# Patient Record
Sex: Female | Born: 1980 | Race: White | Hispanic: No | Marital: Married | Smoking: Never smoker
Health system: Southern US, Community
[De-identification: ages and names within clinical notes are randomized; demographics above are authoritative.]

## PROBLEM LIST (undated history)

## (undated) ENCOUNTER — Inpatient Hospital Stay (HOSPITAL_COMMUNITY): Payer: Self-pay

## (undated) DIAGNOSIS — F419 Anxiety disorder, unspecified: Secondary | ICD-10-CM

## (undated) DIAGNOSIS — L409 Psoriasis, unspecified: Secondary | ICD-10-CM

## (undated) HISTORY — PX: WISDOM TOOTH EXTRACTION: SHX21

## (undated) HISTORY — PX: OTHER SURGICAL HISTORY: SHX169

---

## 2001-11-28 ENCOUNTER — Other Ambulatory Visit: Admission: RE | Admit: 2001-11-28 | Discharge: 2001-11-28 | Payer: Self-pay | Admitting: Obstetrics & Gynecology

## 2002-11-29 ENCOUNTER — Other Ambulatory Visit: Admission: RE | Admit: 2002-11-29 | Discharge: 2002-11-29 | Payer: Self-pay | Admitting: Obstetrics & Gynecology

## 2004-06-08 ENCOUNTER — Emergency Department (HOSPITAL_COMMUNITY): Admission: EM | Admit: 2004-06-08 | Discharge: 2004-06-08 | Payer: Self-pay | Admitting: Family Medicine

## 2004-06-21 ENCOUNTER — Encounter (INDEPENDENT_AMBULATORY_CARE_PROVIDER_SITE_OTHER): Payer: Self-pay | Admitting: *Deleted

## 2004-06-21 ENCOUNTER — Ambulatory Visit (HOSPITAL_BASED_OUTPATIENT_CLINIC_OR_DEPARTMENT_OTHER): Admission: RE | Admit: 2004-06-21 | Discharge: 2004-06-21 | Payer: Self-pay | Admitting: Specialist

## 2005-04-19 ENCOUNTER — Emergency Department (HOSPITAL_COMMUNITY): Admission: EM | Admit: 2005-04-19 | Discharge: 2005-04-19 | Payer: Self-pay | Admitting: Family Medicine

## 2006-11-16 ENCOUNTER — Emergency Department (HOSPITAL_COMMUNITY): Admission: EM | Admit: 2006-11-16 | Discharge: 2006-11-16 | Payer: Self-pay | Admitting: Emergency Medicine

## 2009-02-12 ENCOUNTER — Inpatient Hospital Stay (HOSPITAL_COMMUNITY): Admission: AD | Admit: 2009-02-12 | Discharge: 2009-02-12 | Payer: Self-pay | Admitting: Obstetrics and Gynecology

## 2009-09-11 ENCOUNTER — Inpatient Hospital Stay (HOSPITAL_COMMUNITY): Admission: AD | Admit: 2009-09-11 | Discharge: 2009-09-11 | Payer: Self-pay | Admitting: Obstetrics and Gynecology

## 2009-09-11 ENCOUNTER — Ambulatory Visit: Payer: Self-pay | Admitting: Gynecology

## 2009-09-11 DIAGNOSIS — O209 Hemorrhage in early pregnancy, unspecified: Secondary | ICD-10-CM

## 2009-10-08 ENCOUNTER — Inpatient Hospital Stay (HOSPITAL_COMMUNITY): Admission: AD | Admit: 2009-10-08 | Discharge: 2009-10-12 | Payer: Self-pay | Admitting: Obstetrics and Gynecology

## 2010-05-07 LAB — CBC
HCT: 21.6 % — ABNORMAL LOW (ref 36.0–46.0)
Hemoglobin: 7.5 g/dL — ABNORMAL LOW (ref 12.0–15.0)
Hemoglobin: 7.6 g/dL — ABNORMAL LOW (ref 12.0–15.0)
MCH: 31.8 pg (ref 26.0–34.0)
MCH: 32.4 pg (ref 26.0–34.0)
MCHC: 34.7 g/dL (ref 30.0–36.0)
MCHC: 34.8 g/dL (ref 30.0–36.0)
MCHC: 35.2 g/dL (ref 30.0–36.0)
MCV: 91.7 fL (ref 78.0–100.0)
MCV: 92.2 fL (ref 78.0–100.0)
Platelets: 158 10*3/uL (ref 150–400)
Platelets: 207 10*3/uL (ref 150–400)
RDW: 13.4 % (ref 11.5–15.5)
RDW: 14 % (ref 11.5–15.5)
WBC: 10.3 10*3/uL (ref 4.0–10.5)
WBC: 10.8 10*3/uL — ABNORMAL HIGH (ref 4.0–10.5)

## 2010-05-07 LAB — RH IMMUNE GLOB WKUP(>/=20WKS)(NOT WOMEN'S HOSP)
Antibody Screen: NEGATIVE
Fetal Screen: NEGATIVE

## 2010-05-07 LAB — BASIC METABOLIC PANEL
Calcium: 7.6 mg/dL — ABNORMAL LOW (ref 8.4–10.5)
GFR calc Af Amer: >60 mL/min (ref >60)
GFR calc non Af Amer: >60 mL/min (ref >60)
Glucose, Bld: 107 mg/dL — ABNORMAL HIGH (ref 70–99)
Sodium: 134 mEq/L — ABNORMAL LOW (ref 135–145)

## 2010-05-08 LAB — URINE CULTURE: Culture  Setup Time: 201107230053

## 2010-05-08 LAB — URINALYSIS, ROUTINE W REFLEX MICROSCOPIC
Bilirubin Urine: NEGATIVE
Ketones, ur: NEGATIVE mg/dL
Nitrite: NEGATIVE
Protein, ur: NEGATIVE mg/dL
Urobilinogen, UA: 0.2 mg/dL (ref 0.0–1.0)

## 2010-05-17 ENCOUNTER — Other Ambulatory Visit: Payer: Self-pay | Admitting: Obstetrics and Gynecology

## 2010-05-24 LAB — CBC
Hemoglobin: 12.9 g/dL (ref 12.0–15.0)
MCHC: 34.1 g/dL (ref 30.0–36.0)
MCV: 88.8 fL (ref 78.0–100.0)
RBC: 4.27 MIL/uL (ref 3.87–5.11)
WBC: 8.3 10*3/uL (ref 4.0–10.5)

## 2010-05-24 LAB — URINALYSIS, ROUTINE W REFLEX MICROSCOPIC
Bilirubin Urine: NEGATIVE
Hgb urine dipstick: NEGATIVE
Nitrite: NEGATIVE
Specific Gravity, Urine: 1.015 (ref 1.005–1.030)
pH: 8 (ref 5.0–8.0)

## 2010-05-24 LAB — GC/CHLAMYDIA PROBE AMP, GENITAL: Chlamydia, DNA Probe: NEGATIVE

## 2010-05-24 LAB — ABO/RH: ABO/RH(D): A NEG

## 2010-07-09 NOTE — Op Note (Signed)
Monica Parrish, Monica Parrish                ACCOUNT NO.:  [id number]   MEDICAL RECORD NO.:  [id number]          PATIENT TYPE:  AMB   LOCATION:  DSC                          FACILITY:  [facility]   PHYSICIAN:  Gerald L. Truesdale, M.D.DATE OF BIRTH:  12-01-1980   DATE OF PROCEDURE:  DATE OF DISCHARGE:                                 OPERATIVE REPORT   HISTORY OF PRESENT ILLNESS:  This is a 30 year old lady with a large mass  involving the right posterior back, increased growth, pain, and discomfort.  The area has a consistent stable lipomatous mass.   PROCEDURES DONE:  Excision of mass with liposuction assistance.   ANESTHESIA:  General.   SUMMARY OF PROCEDURE:  The patient underwent general anesthesia, intubated  orally.  Prep was done to the back area after she was placed in the prone  position with Hibiclens soap and solution walled off with sterile towels and  draped in a sterile field.  Xylocaine 1/2% with epinephrine was injected  within the mass, 1-200,000 concentration, a total of 50 mL.  This was  allowed to stand and then incisions were made over the lateral part of the  mass.  A small area was removed for examination with gross examination of a  lipomatous mass.  Using liposuction assistance, we removed the mass as best  we could except the central area which is very firm.  I used a [place]  catheter 28-34.  In the central portion, I had to extend the incision more  and had to use Metzenbaum scissors to remove the mass which seemed to have  consistency and was deep under the fascia of the back.  The fascia was  opened and the mass was removed with sharp and blunt dissection in large  volumes.  After proper hemostasis, irrigation was done.  Hemostasis was  maintained with a Bovie and coagulation.  After this, the wound was closed  with multiple sutures of 5-0 nylon.  Soft fluffy dressings and pressure  dressings were applied.  Xeroform, 4 x 4s, ABDs, Hypafix tape.  She  withstood the  procedures very well and was taken to recovery in excellent  condition.      GLT/MEDQ  D:  06/21/2004  T:  06/21/2004  Job:  [id number]

## 2011-12-29 ENCOUNTER — Other Ambulatory Visit: Payer: Self-pay | Admitting: Family Medicine

## 2011-12-29 DIAGNOSIS — R51 Headache: Secondary | ICD-10-CM

## 2011-12-30 ENCOUNTER — Other Ambulatory Visit: Payer: Self-pay

## 2012-01-03 ENCOUNTER — Ambulatory Visit
Admission: RE | Admit: 2012-01-03 | Discharge: 2012-01-03 | Disposition: A | Discharge status: Home / Self Care | Payer: 59 | Source: Ambulatory Visit | Attending: Family Medicine | Admitting: Family Medicine

## 2012-01-03 DIAGNOSIS — R51 Headache: Secondary | ICD-10-CM

## 2012-03-12 ENCOUNTER — Ambulatory Visit
Admission: RE | Admit: 2012-03-12 | Discharge: 2012-03-12 | Disposition: A | Discharge status: Home / Self Care | Payer: 59 | Source: Ambulatory Visit | Attending: Internal Medicine | Admitting: Internal Medicine

## 2012-03-12 ENCOUNTER — Other Ambulatory Visit: Payer: Self-pay | Admitting: Internal Medicine

## 2012-03-12 DIAGNOSIS — R109 Unspecified abdominal pain: Secondary | ICD-10-CM

## 2012-03-12 MED ORDER — IOHEXOL 300 MG/ML  SOLN
100.0000 mL | Freq: Once | INTRAMUSCULAR | Status: AC | PRN
  Administered 2012-03-12: 100 mL via INTRAVENOUS

## 2012-05-24 ENCOUNTER — Other Ambulatory Visit: Payer: Self-pay | Admitting: Obstetrics and Gynecology

## 2015-08-06 ENCOUNTER — Other Ambulatory Visit: Payer: Self-pay | Admitting: Obstetrics and Gynecology

## 2016-09-20 LAB — OB RESULTS CONSOLE ANTIBODY SCREEN: Antibody Screen: NEGATIVE

## 2016-09-20 LAB — OB RESULTS CONSOLE GC/CHLAMYDIA
Chlamydia: NEGATIVE
Gonorrhea: NEGATIVE

## 2016-09-20 LAB — OB RESULTS CONSOLE HEPATITIS B SURFACE ANTIGEN: HEP B S AG: NEGATIVE

## 2016-09-20 LAB — OB RESULTS CONSOLE ABO/RH: RH TYPE: NEGATIVE

## 2016-09-20 LAB — OB RESULTS CONSOLE HIV ANTIBODY (ROUTINE TESTING): HIV: NONREACTIVE

## 2016-09-20 LAB — OB RESULTS CONSOLE RUBELLA ANTIBODY, IGM: RUBELLA: IMMUNE

## 2016-09-20 LAB — OB RESULTS CONSOLE RPR: RPR: NONREACTIVE

## 2016-11-25 ENCOUNTER — Other Ambulatory Visit (HOSPITAL_COMMUNITY): Payer: Self-pay | Admitting: Obstetrics and Gynecology

## 2016-11-25 DIAGNOSIS — Z3A20 20 weeks gestation of pregnancy: Secondary | ICD-10-CM

## 2016-11-25 DIAGNOSIS — Z3689 Encounter for other specified antenatal screening: Secondary | ICD-10-CM

## 2016-11-25 DIAGNOSIS — O35EXX0 Maternal care for other (suspected) fetal abnormality and damage, fetal genitourinary anomalies, not applicable or unspecified: Secondary | ICD-10-CM

## 2016-11-25 DIAGNOSIS — O09522 Supervision of elderly multigravida, second trimester: Secondary | ICD-10-CM

## 2016-11-25 DIAGNOSIS — O358XX0 Maternal care for other (suspected) fetal abnormality and damage, not applicable or unspecified: Secondary | ICD-10-CM

## 2016-11-28 ENCOUNTER — Encounter (HOSPITAL_COMMUNITY): Payer: Self-pay | Admitting: Obstetrics and Gynecology

## 2016-12-02 ENCOUNTER — Encounter (HOSPITAL_COMMUNITY): Payer: Self-pay | Admitting: *Deleted

## 2016-12-06 ENCOUNTER — Encounter (HOSPITAL_COMMUNITY): Payer: Self-pay

## 2016-12-06 ENCOUNTER — Ambulatory Visit (HOSPITAL_COMMUNITY)
Admission: RE | Admit: 2016-12-06 | Discharge: 2016-12-06 | Disposition: A | Discharge status: Home / Self Care | Payer: BC Managed Care – PPO | Source: Ambulatory Visit | Attending: Obstetrics and Gynecology | Admitting: Obstetrics and Gynecology

## 2016-12-06 ENCOUNTER — Other Ambulatory Visit (HOSPITAL_COMMUNITY): Payer: Self-pay | Admitting: *Deleted

## 2016-12-06 DIAGNOSIS — Z3689 Encounter for other specified antenatal screening: Secondary | ICD-10-CM | POA: Insufficient documentation

## 2016-12-06 DIAGNOSIS — O358XX0 Maternal care for other (suspected) fetal abnormality and damage, not applicable or unspecified: Secondary | ICD-10-CM

## 2016-12-06 DIAGNOSIS — Z3A20 20 weeks gestation of pregnancy: Secondary | ICD-10-CM | POA: Diagnosis not present

## 2016-12-06 DIAGNOSIS — O35EXX0 Maternal care for other (suspected) fetal abnormality and damage, fetal genitourinary anomalies, not applicable or unspecified: Secondary | ICD-10-CM

## 2016-12-06 DIAGNOSIS — O34219 Maternal care for unspecified type scar from previous cesarean delivery: Secondary | ICD-10-CM | POA: Insufficient documentation

## 2016-12-06 DIAGNOSIS — O09522 Supervision of elderly multigravida, second trimester: Secondary | ICD-10-CM | POA: Insufficient documentation

## 2016-12-06 DIAGNOSIS — O359XX0 Maternal care for (suspected) fetal abnormality and damage, unspecified, not applicable or unspecified: Secondary | ICD-10-CM

## 2016-12-06 HISTORY — DX: Anxiety disorder, unspecified: F41.9

## 2016-12-06 HISTORY — DX: Psoriasis, unspecified: L40.9

## 2016-12-07 ENCOUNTER — Other Ambulatory Visit (HOSPITAL_COMMUNITY): Payer: Self-pay

## 2016-12-08 DIAGNOSIS — Z3A20 20 weeks gestation of pregnancy: Secondary | ICD-10-CM | POA: Insufficient documentation

## 2016-12-08 DIAGNOSIS — O35EXX0 Maternal care for other (suspected) fetal abnormality and damage, fetal genitourinary anomalies, not applicable or unspecified: Secondary | ICD-10-CM | POA: Insufficient documentation

## 2016-12-08 DIAGNOSIS — O358XX0 Maternal care for other (suspected) fetal abnormality and damage, not applicable or unspecified: Secondary | ICD-10-CM | POA: Insufficient documentation

## 2016-12-08 NOTE — Progress Notes (Signed)
Genetic Counseling  High-Risk Gestation Note  Appointment Date:  12/08/2016 Referred By: Waynard Reeds, MD Date of Birth:  23-Nov-1980 Partner:  Monica Parrish   Pregnancy History: [id number] Estimated Date of Delivery: 04/21/17 Estimated Gestational Age: [redacted]w[redacted]d Attending: Ledon Snare, MD   [name] and her husband, Mr. Monica Parrish, were seen for genetic counseling because of ultrasound finding of bilateral urinary tract dilation.     In summary:  Reviewed ultrasound findings  Bilateral urinary tract dilation: Right kidney 16 mm and left kidney 14 mm  Reviewed results of previous screening  NIPS (Panorama)- within normal limits  Discussed plan for follow-up  Follow-up ultrasound- scheduled 01/17/17  Pediatric urology consult- will facilitate closer to the third trimester with [facility]  Discussed diagnostic testing options for chromosome analysis  Amniocentesis - declined  Reviewed family history concerns  Discussed carrier screening options- declined  CF  SMA  Hemoglobinopathies  Monica Parrish had ultrasound in her referring OB's office, which visualized bilateral hydronephrosis (urinary tract dilation, pyelectasis). Detailed ultrasound was performed today and bilateral urinary tract dilation (UTD) was visualized: right kidney measured 16 mm, and left kidney measured 14 mm.  Remaining visualized fetal anatomy was within normal limits.   We discussed that fetal UTD (hydronephrosis or pyelectasis)  is defined as the dilatation of the fetal renal pelvis/pelvises.  We discussed that there is some overlap between the normal and abnormal pelvic anteroposterior (AP) diameters, lending to variation in cut-off values.  However, most sources agree that >4 mm AP diameter is considered atypical at anatomy scan. We discussed that 28mm-7mm is typically referred to as mild pyelectasis, with 7 mm-10 mm referred to as moderate, and >10 mm referred to as  severe hydronephrosis.  Given the ultrasound findings, the UTD would be classified at UTD A2-3.   [name] was counseled that UTD is commonly observed by prenatal ultrasound and is estimated to occur in 2-3% of fetuses.  The female to female ratio is 2:1.  Fetal urinary tract dilation can be transient but larger dilations are more likely to be due to an underlying congenital anomaly of the kidney and urinary tract.   We discussed that hydronephrosis may be due to obstruction along the urinary tract, causing an excess collection of fluid in the renal pelvis and can also be caused by vesicoureteral reflux (VUR).  The differential diagnoses can include UPJ obstruction, vesicoureteral reflux (VUR), UVJ obstruction, PUV, and multi or polycystic kidneys.  She was counseled that a definitive etiology may not be determined until after delivery. Severe hydronephrosis is more likely to be associated with an underlying congenital anomaly of the kidney and urinary tract and also to require postnatal surgical intervention than milder cases of UTD.   UPJ obstruction is the most common cause of non-physiologic neonatal hydronephrosis and represents 44-65% of cases, and VUR represents the second most common cause.   [name] was also counseled regarding prenatal and postnatal management of hydronephrosis.  We discussed the options of serial ultrasounds and consultation with a pediatric urologist.  We discussed that a consultation can be delayed until 2-3 days of life; however, given that the left AP diameter is >10 mm, postnatal evaluation is recommended.  We discussed that hydronephrosis may require neonatal surgical intervention. Follow-up ultrasound is scheduled for 01/17/17. The couple are interested in a prenatal consultation with pediatric urology, to be facilitated with [facility] closer to the third trimester in pregnancy.   They were  counseled that hydronephrosis is most often  considered multifactorial in etiology, but dominant inheritance has been observed in some families.  We discussed that the finding of hydronephrosis/pyelectasis is associated with an increased risk for fetal aneuploidy.  This risk is highest when other anomalies or fetal differences are visualized.  In the case of isolated hydronephrosis/pyelectasis, the risk of aneuploidy is increased approximately 2 fold. [name] previously had noninvasive prenatal screening (NIPS)/prenatal cell free DNA testing, which was within normal limits for the conditions screened. We reviewed that specifically, she had Panorama through [facility], facilitated by her OB provider, indicating low risk (<1 in 10,000) for trisomy 8721, trisomy 5218, trisomy 3213, sex chromosome aneuploidies, and low risk for 22q11.2 deletion syndrome. We reviewed the methodology of this test, sensitivity and specificity. They understand that NIPS is highly sensitive and specific but is not considered to be diagnostic. We reviewed chromosomes, nondisjunction, and the association with risk for chromosome conditions due to nondisjunction with aging of the ova. We discussed less commonly other chromosome aberrations could be present in the context of fetal UTD such as deletions, duplications or inversions. We reviewed amniocentesis for karyotype and chromosome microarray analysis, including the benefits, risks, and limitations. [name] declined amniocentesis. They understand that prenatal screening and testing cannot diagnose or rule out all genetic conditions or birth defects.   [name] was provided with written information regarding cystic fibrosis (CF), spinal muscular atrophy (SMA) and hemoglobinopathies including the carrier frequency, availability of carrier screening and prenatal diagnosis if indicated.  In addition, we discussed that CF and hemoglobinopathies are routinely screened for as part of the [place] newborn screening panel.   After further discussion, they declined screening for CF, SMA and hemoglobinopathies.  Both family histories were reviewed and found to be noncontributory for birth defects, intellectual disability, and known genetic conditions. [name] reported a strong family history of colon cancer. Her mother was diagnosed at age 35 years old and died at age 257 years with colon cancer. The patient reported that her maternal aunt, maternal uncle, and maternal grandfather have had colon cancer also. Though most cancers are thought to be sporadic or due to environmental factors, some families appear to have a strong predisposition to cancers.  When considering a family history of cancer, we look for common types of cancer in multiple family members occurring at younger than typical ages. we discussed the option of meeting with a cancer genetic counselor to discuss any possible screening or testing options available. If she or her relatives would like to learn more about the family's chance for a possible inherited cancer syndrome, her doctor may refer her or her relatives to [facility] [phone]) or [facility] at [phone]. [name] reported that her physician is aware of this family history, and she is being screened appropriately. Without further information regarding the provided family history, an accurate genetic risk cannot be calculated. Further genetic counseling is warranted if more information is obtained.  [name] denied exposure to environmental toxins or chemical agents. She denied the use of alcohol, tobacco or street drugs. She denied significant viral illnesses during the course of her pregnancy.   I counseled this couple regarding the above risks and available options.  The approximate face-to-face time with the genetic counselor was 30 minutes.  Quinn PlowmanKaren Kyra Laffey, MS,  Certified Genetic Counselor 12/08/2016

## 2017-01-17 ENCOUNTER — Ambulatory Visit (HOSPITAL_COMMUNITY)
Admission: RE | Admit: 2017-01-17 | Discharge: 2017-01-17 | Disposition: A | Discharge status: Home / Self Care | Payer: BC Managed Care – PPO | Source: Ambulatory Visit | Attending: Obstetrics and Gynecology | Admitting: Obstetrics and Gynecology

## 2017-01-17 ENCOUNTER — Other Ambulatory Visit (HOSPITAL_COMMUNITY): Payer: Self-pay | Admitting: *Deleted

## 2017-01-17 ENCOUNTER — Encounter (HOSPITAL_COMMUNITY): Payer: Self-pay

## 2017-01-17 DIAGNOSIS — O34219 Maternal care for unspecified type scar from previous cesarean delivery: Secondary | ICD-10-CM | POA: Diagnosis not present

## 2017-01-17 DIAGNOSIS — Z362 Encounter for other antenatal screening follow-up: Secondary | ICD-10-CM | POA: Insufficient documentation

## 2017-01-17 DIAGNOSIS — O359XX0 Maternal care for (suspected) fetal abnormality and damage, unspecified, not applicable or unspecified: Secondary | ICD-10-CM | POA: Diagnosis not present

## 2017-01-17 DIAGNOSIS — Z3A26 26 weeks gestation of pregnancy: Secondary | ICD-10-CM | POA: Insufficient documentation

## 2017-01-17 DIAGNOSIS — O09522 Supervision of elderly multigravida, second trimester: Secondary | ICD-10-CM | POA: Diagnosis present

## 2017-01-17 DIAGNOSIS — N135 Crossing vessel and stricture of ureter without hydronephrosis: Secondary | ICD-10-CM

## 2017-02-15 ENCOUNTER — Encounter (HOSPITAL_COMMUNITY): Payer: Self-pay

## 2017-02-15 ENCOUNTER — Ambulatory Visit (HOSPITAL_COMMUNITY)
Admission: RE | Admit: 2017-02-15 | Discharge: 2017-02-15 | Disposition: A | Discharge status: Home / Self Care | Payer: BC Managed Care – PPO | Source: Ambulatory Visit | Attending: Obstetrics and Gynecology | Admitting: Obstetrics and Gynecology

## 2017-02-15 ENCOUNTER — Other Ambulatory Visit (HOSPITAL_COMMUNITY): Payer: Self-pay | Admitting: Maternal and Fetal Medicine

## 2017-02-15 DIAGNOSIS — Z3A30 30 weeks gestation of pregnancy: Secondary | ICD-10-CM

## 2017-02-15 DIAGNOSIS — O09523 Supervision of elderly multigravida, third trimester: Secondary | ICD-10-CM

## 2017-02-15 DIAGNOSIS — O359XX0 Maternal care for (suspected) fetal abnormality and damage, unspecified, not applicable or unspecified: Secondary | ICD-10-CM | POA: Insufficient documentation

## 2017-02-15 DIAGNOSIS — N135 Crossing vessel and stricture of ureter without hydronephrosis: Secondary | ICD-10-CM

## 2017-02-15 DIAGNOSIS — O34219 Maternal care for unspecified type scar from previous cesarean delivery: Secondary | ICD-10-CM | POA: Insufficient documentation

## 2017-02-27 ENCOUNTER — Other Ambulatory Visit: Payer: Self-pay

## 2017-02-27 ENCOUNTER — Encounter (HOSPITAL_COMMUNITY): Payer: Self-pay

## 2017-02-27 ENCOUNTER — Inpatient Hospital Stay (HOSPITAL_COMMUNITY)
Admission: AD | Admit: 2017-02-27 | Discharge: 2017-02-28 | Disposition: A | Discharge status: Home / Self Care | Payer: BC Managed Care – PPO | Source: Ambulatory Visit | Attending: Obstetrics and Gynecology | Admitting: Obstetrics and Gynecology

## 2017-02-27 DIAGNOSIS — R109 Unspecified abdominal pain: Secondary | ICD-10-CM | POA: Diagnosis present

## 2017-02-27 DIAGNOSIS — O4703 False labor before 37 completed weeks of gestation, third trimester: Secondary | ICD-10-CM | POA: Diagnosis not present

## 2017-02-27 DIAGNOSIS — Z3A32 32 weeks gestation of pregnancy: Secondary | ICD-10-CM | POA: Diagnosis not present

## 2017-02-27 DIAGNOSIS — O26893 Other specified pregnancy related conditions, third trimester: Secondary | ICD-10-CM | POA: Diagnosis present

## 2017-02-27 LAB — COMPREHENSIVE METABOLIC PANEL
ALT: 14 U/L (ref 14–54)
ANION GAP: 11 (ref 5–15)
AST: 19 U/L (ref 15–41)
Albumin: 2.8 g/dL — ABNORMAL LOW (ref 3.5–5.0)
Alkaline Phosphatase: 160 U/L — ABNORMAL HIGH (ref 38–126)
BUN: 9 mg/dL (ref 6–20)
CALCIUM: 8.7 mg/dL — AB (ref 8.9–10.3)
CO2: 20 mmol/L — AB (ref 22–32)
Chloride: 104 mmol/L (ref 101–111)
Creatinine, Ser: 0.6 mg/dL (ref 0.44–1.00)
Glucose, Bld: 94 mg/dL (ref 65–99)
Potassium: 3.9 mmol/L (ref 3.5–5.1)
SODIUM: 135 mmol/L (ref 135–145)
TOTAL PROTEIN: 6.6 g/dL (ref 6.5–8.1)
Total Bilirubin: 0.5 mg/dL (ref 0.3–1.2)

## 2017-02-27 LAB — CBC
HCT: 32.6 % — ABNORMAL LOW (ref 36.0–46.0)
Hemoglobin: 11 g/dL — ABNORMAL LOW (ref 12.0–15.0)
MCH: 28.7 pg (ref 26.0–34.0)
MCHC: 33.7 g/dL (ref 30.0–36.0)
MCV: 85.1 fL (ref 78.0–100.0)
Platelets: 222 10*3/uL (ref 150–400)
RBC: 3.83 MIL/uL — ABNORMAL LOW (ref 3.87–5.11)
RDW: 13.7 % (ref 11.5–15.5)
WBC: 11.5 10*3/uL — ABNORMAL HIGH (ref 4.0–10.5)

## 2017-02-27 LAB — URINALYSIS, ROUTINE W REFLEX MICROSCOPIC
BILIRUBIN URINE: NEGATIVE
GLUCOSE, UA: NEGATIVE mg/dL
Hgb urine dipstick: NEGATIVE
KETONES UR: NEGATIVE mg/dL
Nitrite: NEGATIVE
PH: 6 (ref 5.0–8.0)
Protein, ur: NEGATIVE mg/dL
SPECIFIC GRAVITY, URINE: 1.004 — AB (ref 1.005–1.030)

## 2017-02-27 MED ORDER — NIFEDIPINE 10 MG PO CAPS
10.0000 mg | ORAL_CAPSULE | ORAL | Status: DC | PRN
  Administered 2017-02-27 (×3): 10 mg via ORAL
  Filled 2017-02-27 (×3): qty 1

## 2017-02-27 MED ORDER — LACTATED RINGERS IV BOLUS (SEPSIS): 1000.0000 mL | Freq: Once | INTRAVENOUS | Status: DC

## 2017-02-27 MED ORDER — LACTATED RINGERS IV BOLUS (SEPSIS)
1000.0000 mL | Freq: Once | INTRAVENOUS | Status: AC
  Administered 2017-02-27: 1000 mL via INTRAVENOUS

## 2017-02-27 NOTE — MAU Note (Signed)
Pt presents to MAU with c/o lower abdominal pain that started around 1900 tonight. Pt denies VB and LOF. +FM

## 2017-02-27 NOTE — Discharge Instructions (Signed)

## 2017-02-27 NOTE — MAU Provider Note (Signed)
History     CSN: [id number]  Arrival date and time: 02/27/17 2014   First Provider Initiated Contact with Patient 02/27/17 2042     Chief Complaint  Patient presents with  . Abdominal Pain   HPI Monica Parrish is a 37 y.o. G2P1001 at [redacted]w[redacted]d who presents with lower abdominal cramping. She states she feels like she started having braxton hicks contractions around noon that became painful around 7pm. She states they are sometimes every 5 minutes and sometimes every 15 minutes. She rates the pain a 5/10 and has not tried anything for the pain. She denies any vaginal bleeding, leaking of fluid or discharge. Reports good fetal movement. She also reports increasing dizziness of the last few hours. Reports drinking water and eating regular meals. She states she was anemic at her 28 week labs but is not taking any iron supplements.  OB History    Gravida Para Term Preterm AB Living   2 1 1     1    SAB TAB Ectopic Multiple Live Births                  Past Medical History:  Diagnosis Date  . Anxiety   . Psoriasis     Past Surgical History:  Procedure Laterality Date  . CESAREAN SECTION    . Surgery Left arm Left    bone spur removed  . WISDOM TOOTH EXTRACTION      History reviewed. No pertinent family history.  Social History   Tobacco Use  . Smoking status: Never Smoker  . Smokeless tobacco: Never Used  Substance Use Topics  . Alcohol use: No  . Drug use: No    Allergies:  Allergies  Allergen Reactions  . Penicillins Rash    Medications Prior to Admission  Medication Sig Dispense Refill Last Dose  . Prenatal Multivit-Min-Fe-FA (PRENATAL VITAMINS PO) Take by mouth.   Taking  . RaNITidine HCl (ZANTAC PO) Take by mouth.   Taking    Review of Systems  Constitutional: Negative.  Negative for fatigue and fever.  HENT: Negative.   Respiratory: Negative.  Negative for shortness of breath.   Cardiovascular: Negative.  Negative for chest pain.  Gastrointestinal: Positive  for abdominal pain. Negative for constipation, diarrhea, nausea and vomiting.  Genitourinary: Negative.  Negative for dysuria, vaginal bleeding and vaginal discharge.  Neurological: Negative.  Negative for dizziness and headaches.   Physical Exam   Blood pressure 113/74, pulse (!) 112, temperature 97.9 F (36.6 C), temperature source Oral, resp. rate 18, SpO2 100 %.  Physical Exam  Nursing note and vitals reviewed. Constitutional: She is oriented to person, place, and time. She appears well-developed and well-nourished. No distress.  HENT:  Head: Normocephalic.  Eyes: Pupils are equal, round, and reactive to light.  Cardiovascular: Normal rate, regular rhythm and normal heart sounds.  Respiratory: Effort normal and breath sounds normal. No respiratory distress.  GI: Soft. Bowel sounds are normal. She exhibits no distension. There is no tenderness.  Neurological: She is alert and oriented to person, place, and time.  Skin: Skin is warm and dry.  Psychiatric: She has a normal mood and affect. Her behavior is normal. Judgment and thought content normal.   Dilation: Closed Cervical Position: Posterior Exam by:: Antony Odea, CNM  Fetal Tracing:  Baseline: 130 Variability: moderate Accels: 15x15 Decels: none  Toco: every 2-3 minutes  MAU Course  Procedures Results for orders placed or performed during the hospital encounter of 02/27/17 (from the past 24  hour(s))  Urinalysis, Routine w reflex microscopic     Status: Abnormal   Collection Time: 02/27/17  8:20 PM  Result Value Ref Range   Color, Urine STRAW (A) YELLOW   APPearance CLEAR CLEAR   Specific Gravity, Urine 1.004 (L) 1.005 - 1.030   pH 6.0 5.0 - 8.0   Glucose, UA NEGATIVE NEGATIVE mg/dL   Hgb urine dipstick NEGATIVE NEGATIVE   Bilirubin Urine NEGATIVE NEGATIVE   Ketones, ur NEGATIVE NEGATIVE mg/dL   Protein, ur NEGATIVE NEGATIVE mg/dL   Nitrite NEGATIVE NEGATIVE   Leukocytes, UA TRACE (A) NEGATIVE   RBC / HPF  0-5 0 - 5 RBC/hpf   WBC, UA 0-5 0 - 5 WBC/hpf   Bacteria, UA RARE (A) NONE SEEN   Squamous Epithelial / LPF 0-5 (A) NONE SEEN   Mucus PRESENT   CBC     Status: Abnormal   Collection Time: 02/27/17  8:53 PM  Result Value Ref Range   WBC 11.5 (H) 4.0 - 10.5 K/uL   RBC 3.83 (L) 3.87 - 5.11 MIL/uL   Hemoglobin 11.0 (L) 12.0 - 15.0 g/dL   HCT 16.132.6 (L) 09.636.0 - 04.546.0 %   MCV 85.1 78.0 - 100.0 fL   MCH 28.7 26.0 - 34.0 pg   MCHC 33.7 30.0 - 36.0 g/dL   RDW 40.913.7 81.111.5 - 91.415.5 %   Platelets 222 150 - 400 K/uL  Comprehensive metabolic panel     Status: Abnormal   Collection Time: 02/27/17  8:53 PM  Result Value Ref Range   Sodium 135 135 - 145 mmol/L   Potassium 3.9 3.5 - 5.1 mmol/L   Chloride 104 101 - 111 mmol/L   CO2 20 (L) 22 - 32 mmol/L   Glucose, Bld 94 65 - 99 mg/dL   BUN 9 6 - 20 mg/dL   Creatinine, Ser 7.820.60 0.44 - 1.00 mg/dL   Calcium 8.7 (L) 8.9 - 10.3 mg/dL   Total Protein 6.6 6.5 - 8.1 g/dL   Albumin 2.8 (L) 3.5 - 5.0 g/dL   AST 19 15 - 41 U/L   ALT 14 14 - 54 U/L   Alkaline Phosphatase 160 (H) 38 - 126 U/L   Total Bilirubin 0.5 0.3 - 1.2 mg/dL   GFR calc non Af Amer >60 >60 mL/min   GFR calc Af Amer >60 >60 mL/min   Anion gap 11 5 - 15    MDM UA CBC, CMP Procardia IV fluids Patient reports relief from contractions and no cervical change over 2 hours Reviewed with Dr. Dareen PianoAnderson- ok to discharge patient home Assessment and Plan   1. Threatened preterm labor, third trimester   2. [redacted] weeks gestation of pregnancy   3. Abdominal pain during pregnancy in third trimester    -Discharge home in stable condition -Preterm labor precautions discussed -[facility] -Patient may return to MAU as needed or if her condition were to change or worsen   Rolm BookbinderCaroline M Jakota Manthei CNM 02/27/2017, 11:12 PM

## 2017-02-28 MED ORDER — NIFEDIPINE 10 MG PO CAPS: 10.0000 mg | ORAL_CAPSULE | Freq: Four times a day (QID) | ORAL | 0 refills | Status: DC | PRN

## 2017-02-28 NOTE — MAU Note (Signed)
Pt was discharged and requested to be put back on the monitor as she said her contractions were getting stronger again.

## 2017-03-15 ENCOUNTER — Other Ambulatory Visit: Payer: Self-pay | Admitting: Obstetrics and Gynecology

## 2017-03-22 LAB — OB RESULTS CONSOLE GBS: GBS: NEGATIVE

## 2017-04-03 ENCOUNTER — Telehealth (HOSPITAL_COMMUNITY): Payer: Self-pay | Admitting: *Deleted

## 2017-04-03 ENCOUNTER — Encounter (HOSPITAL_COMMUNITY): Payer: Self-pay

## 2017-04-03 NOTE — Telephone Encounter (Signed)
Preadmission screen  

## 2017-04-04 ENCOUNTER — Encounter (HOSPITAL_COMMUNITY): Payer: Self-pay

## 2017-04-09 ENCOUNTER — Inpatient Hospital Stay (HOSPITAL_COMMUNITY)
Admission: AD | Admit: 2017-04-09 | Discharge: 2017-04-11 | DRG: 788 | Disposition: A | Discharge status: Home / Self Care | Payer: BC Managed Care – PPO | Source: Ambulatory Visit | Attending: Obstetrics and Gynecology | Admitting: Obstetrics and Gynecology

## 2017-04-09 ENCOUNTER — Inpatient Hospital Stay (HOSPITAL_COMMUNITY): Payer: BC Managed Care – PPO | Admitting: Anesthesiology

## 2017-04-09 ENCOUNTER — Other Ambulatory Visit: Payer: Self-pay

## 2017-04-09 ENCOUNTER — Encounter (HOSPITAL_COMMUNITY): Payer: Self-pay | Admitting: *Deleted

## 2017-04-09 ENCOUNTER — Encounter (HOSPITAL_COMMUNITY)
Admission: AD | Disposition: A | Discharge status: Home / Self Care | Payer: Self-pay | Source: Ambulatory Visit | Attending: Obstetrics and Gynecology

## 2017-04-09 DIAGNOSIS — Z6791 Unspecified blood type, Rh negative: Secondary | ICD-10-CM

## 2017-04-09 DIAGNOSIS — Z9889 Other specified postprocedural states: Secondary | ICD-10-CM

## 2017-04-09 DIAGNOSIS — Z3A38 38 weeks gestation of pregnancy: Secondary | ICD-10-CM

## 2017-04-09 DIAGNOSIS — O34211 Maternal care for low transverse scar from previous cesarean delivery: Principal | ICD-10-CM | POA: Diagnosis present

## 2017-04-09 DIAGNOSIS — O26893 Other specified pregnancy related conditions, third trimester: Secondary | ICD-10-CM | POA: Diagnosis present

## 2017-04-09 LAB — CBC
HEMATOCRIT: 33.2 % — AB (ref 36.0–46.0)
Hemoglobin: 11.3 g/dL — ABNORMAL LOW (ref 12.0–15.0)
MCH: 28.8 pg (ref 26.0–34.0)
MCHC: 34 g/dL (ref 30.0–36.0)
MCV: 84.7 fL (ref 78.0–100.0)
PLATELETS: 227 10*3/uL (ref 150–400)
RBC: 3.92 MIL/uL (ref 3.87–5.11)
RDW: 14.3 % (ref 11.5–15.5)
WBC: 11.5 10*3/uL — ABNORMAL HIGH (ref 4.0–10.5)

## 2017-04-09 LAB — URINALYSIS, ROUTINE W REFLEX MICROSCOPIC
BILIRUBIN URINE: NEGATIVE
Glucose, UA: NEGATIVE mg/dL
Hgb urine dipstick: NEGATIVE
Ketones, ur: NEGATIVE mg/dL
Nitrite: NEGATIVE
Protein, ur: NEGATIVE mg/dL
SPECIFIC GRAVITY, URINE: 1.013 (ref 1.005–1.030)
pH: 7 (ref 5.0–8.0)

## 2017-04-09 SURGERY — Surgical Case
Anesthesia: Spinal

## 2017-04-09 MED ORDER — LACTATED RINGERS IV SOLN
INTRAVENOUS | Status: DC | PRN
  Administered 2017-04-09: 23:00:00 via INTRAVENOUS

## 2017-04-09 MED ORDER — ONDANSETRON HCL 4 MG/2ML IJ SOLN
INTRAMUSCULAR | Status: AC
  Filled 2017-04-09: qty 2

## 2017-04-09 MED ORDER — GENTAMICIN SULFATE 40 MG/ML IJ SOLN
INTRAVENOUS | Status: DC
  Filled 2017-04-09: qty 11.5

## 2017-04-09 MED ORDER — DEXAMETHASONE SODIUM PHOSPHATE 10 MG/ML IJ SOLN
INTRAMUSCULAR | Status: DC | PRN
  Administered 2017-04-09: 10 mg via INTRAVENOUS

## 2017-04-09 MED ORDER — FENTANYL CITRATE (PF) 100 MCG/2ML IJ SOLN
INTRAMUSCULAR | Status: AC
  Filled 2017-04-09: qty 2

## 2017-04-09 MED ORDER — MORPHINE SULFATE (PF) 0.5 MG/ML IJ SOLN
INTRAMUSCULAR | Status: AC
  Filled 2017-04-09: qty 10

## 2017-04-09 MED ORDER — MORPHINE SULFATE (PF) 0.5 MG/ML IJ SOLN
INTRAMUSCULAR | Status: DC | PRN
  Administered 2017-04-09: .2 mg via INTRATHECAL

## 2017-04-09 MED ORDER — FAMOTIDINE IN NACL 20-0.9 MG/50ML-% IV SOLN
20.0000 mg | Freq: Once | INTRAVENOUS | Status: AC
  Administered 2017-04-09: 20 mg via INTRAVENOUS
  Filled 2017-04-09: qty 50

## 2017-04-09 MED ORDER — PHENYLEPHRINE 40 MCG/ML (10ML) SYRINGE FOR IV PUSH (FOR BLOOD PRESSURE SUPPORT)
PREFILLED_SYRINGE | INTRAVENOUS | Status: AC
  Filled 2017-04-09: qty 10

## 2017-04-09 MED ORDER — SODIUM CHLORIDE 0.9 % IR SOLN
Status: DC | PRN
  Administered 2017-04-09: 1000 mL

## 2017-04-09 MED ORDER — DEXAMETHASONE SODIUM PHOSPHATE 10 MG/ML IJ SOLN
INTRAMUSCULAR | Status: AC
  Filled 2017-04-09: qty 1

## 2017-04-09 MED ORDER — CLINDAMYCIN PHOSPHATE 900 MG/50ML IV SOLN: 900.0000 mg | INTRAVENOUS | Status: DC

## 2017-04-09 MED ORDER — LACTATED RINGERS IV SOLN
INTRAVENOUS | Status: DC
  Administered 2017-04-09 (×2): via INTRAVENOUS

## 2017-04-09 MED ORDER — ATROPINE SULFATE 0.4 MG/ML IJ SOLN
INTRAMUSCULAR | Status: DC | PRN
  Administered 2017-04-09: 0.2 mg via INTRAVENOUS

## 2017-04-09 MED ORDER — PHENYLEPHRINE 8 MG IN D5W 100 ML (0.08MG/ML) PREMIX OPTIME
INJECTION | INTRAVENOUS | Status: AC
  Filled 2017-04-09: qty 100

## 2017-04-09 MED ORDER — OXYTOCIN 10 UNIT/ML IJ SOLN
INTRAMUSCULAR | Status: AC
  Filled 2017-04-09: qty 4

## 2017-04-09 MED ORDER — FENTANYL CITRATE (PF) 100 MCG/2ML IJ SOLN
INTRAMUSCULAR | Status: DC | PRN
  Administered 2017-04-09: 10 ug via INTRATHECAL

## 2017-04-09 MED ORDER — SOD CITRATE-CITRIC ACID 500-334 MG/5ML PO SOLN
30.0000 mL | Freq: Once | ORAL | Status: AC
  Administered 2017-04-09: 30 mL via ORAL
  Filled 2017-04-09: qty 15

## 2017-04-09 MED ORDER — OXYTOCIN 10 UNIT/ML IJ SOLN
INTRAVENOUS | Status: DC | PRN
  Administered 2017-04-09: 40 [IU] via INTRAVENOUS

## 2017-04-09 MED ORDER — GENTAMICIN SULFATE 40 MG/ML IJ SOLN: 5.0000 mg/kg | INTRAMUSCULAR | Status: DC

## 2017-04-09 MED ORDER — PHENYLEPHRINE 40 MCG/ML (10ML) SYRINGE FOR IV PUSH (FOR BLOOD PRESSURE SUPPORT)
PREFILLED_SYRINGE | INTRAVENOUS | Status: DC | PRN
  Administered 2017-04-09: 80 ug via INTRAVENOUS

## 2017-04-09 MED ORDER — SCOPOLAMINE 1 MG/3DAYS TD PT72
MEDICATED_PATCH | TRANSDERMAL | Status: DC | PRN
  Administered 2017-04-09: 1 via TRANSDERMAL

## 2017-04-09 MED ORDER — ONDANSETRON HCL 4 MG/2ML IJ SOLN
INTRAMUSCULAR | Status: DC | PRN
  Administered 2017-04-09: 4 mg via INTRAVENOUS

## 2017-04-09 MED ORDER — LACTATED RINGERS IV BOLUS (SEPSIS)
1000.0000 mL | Freq: Once | INTRAVENOUS | Status: AC
  Administered 2017-04-09: 1000 mL via INTRAVENOUS

## 2017-04-09 MED ORDER — BUPIVACAINE IN DEXTROSE 0.75-8.25 % IT SOLN
INTRATHECAL | Status: DC | PRN
  Administered 2017-04-09: 1.6 mg via INTRATHECAL

## 2017-04-09 MED ORDER — SCOPOLAMINE 1 MG/3DAYS TD PT72
MEDICATED_PATCH | TRANSDERMAL | Status: AC
  Filled 2017-04-09: qty 1

## 2017-04-09 MED ORDER — ATROPINE SULFATE 0.4 MG/ML IJ SOLN
INTRAMUSCULAR | Status: AC
  Filled 2017-04-09: qty 1

## 2017-04-09 SURGICAL SUPPLY — 32 items
CHLORAPREP W/TINT 26ML (MISCELLANEOUS) ×3 IMPLANT
CLAMP CORD UMBIL (MISCELLANEOUS) IMPLANT
CLOTH BEACON ORANGE TIMEOUT ST (SAFETY) ×3 IMPLANT
DERMABOND ADVANCED (GAUZE/BANDAGES/DRESSINGS)
DERMABOND ADVANCED .7 DNX12 (GAUZE/BANDAGES/DRESSINGS) IMPLANT
DRSG OPSITE POSTOP 4X10 (GAUZE/BANDAGES/DRESSINGS) ×3 IMPLANT
ELECT REM PT RETURN 9FT ADLT (ELECTROSURGICAL) ×3
ELECTRODE REM PT RTRN 9FT ADLT (ELECTROSURGICAL) ×1 IMPLANT
EXTRACTOR VACUUM M CUP 4 TUBE (SUCTIONS) IMPLANT
EXTRACTOR VACUUM M CUP 4' TUBE (SUCTIONS)
GLOVE BIO SURGEON STRL SZ7 (GLOVE) ×3 IMPLANT
GLOVE BIOGEL PI IND STRL 7.0 (GLOVE) ×1 IMPLANT
GLOVE BIOGEL PI INDICATOR 7.0 (GLOVE) ×2
GOWN STRL REUS W/TWL LRG LVL3 (GOWN DISPOSABLE) ×6 IMPLANT
KIT ABG SYR 3ML LUER SLIP (SYRINGE) IMPLANT
NEEDLE HYPO 22GX1.5 SAFETY (NEEDLE) IMPLANT
NEEDLE HYPO 25X5/8 SAFETYGLIDE (NEEDLE) IMPLANT
NS IRRIG 1000ML POUR BTL (IV SOLUTION) ×3 IMPLANT
PACK C SECTION WH (CUSTOM PROCEDURE TRAY) ×3 IMPLANT
PAD OB MATERNITY 4.3X12.25 (PERSONAL CARE ITEMS) ×3 IMPLANT
PENCIL SMOKE EVAC W/HOLSTER (ELECTROSURGICAL) ×3 IMPLANT
RTRCTR C-SECT PINK 25CM LRG (MISCELLANEOUS) ×3 IMPLANT
SUT CHROMIC 1 CTX 36 (SUTURE) ×6 IMPLANT
SUT CHROMIC 2 0 CT 1 (SUTURE) ×3 IMPLANT
SUT PDS AB 0 CTX 60 (SUTURE) ×3 IMPLANT
SUT PLAIN 2 0 XLH (SUTURE) ×3 IMPLANT
SUT VIC AB 2-0 CT1 27 (SUTURE) ×2
SUT VIC AB 2-0 CT1 TAPERPNT 27 (SUTURE) ×1 IMPLANT
SUT VIC AB 4-0 KS 27 (SUTURE) IMPLANT
SYR 30ML LL (SYRINGE) IMPLANT
TOWEL OR 17X24 6PK STRL BLUE (TOWEL DISPOSABLE) ×3 IMPLANT
TRAY FOLEY BAG SILVER LF 14FR (SET/KITS/TRAYS/PACK) ×3 IMPLANT

## 2017-04-09 NOTE — H&P (Signed)
37 y.o.  G2P1001 1572w2d comes in with labor.  Pt desires a repeat cesarean section at term.  Patient has good fetal movement and no bleeding.   Past Medical History:  Diagnosis Date  . Anxiety   . Psoriasis     Past Surgical History:  Procedure Laterality Date  . CESAREAN SECTION    . Surgery Left arm Left    bone spur removed  . WISDOM TOOTH EXTRACTION      OB History  Gravida Para Term Preterm AB Living  2 1 1     1   SAB TAB Ectopic Multiple Live Births          1    # Outcome Date GA Lbr Len/2nd Weight Sex Delivery Anes PTL Lv  2 Current           1 Term 2011 4025w0d  7 lb 4 oz (3.289 kg) M CS-LTranv Spinal  LIV      Social History   Socioeconomic History  . Marital status: Married    Spouse name: Not on file  . Number of children: Not on file  . Years of education: Not on file  . Highest education level: Not on file  Social Needs  . Financial resource strain: Not on file  . Food insecurity - worry: Not on file  . Food insecurity - inability: Not on file  . Transportation needs - medical: Not on file  . Transportation needs - non-medical: Not on file  Occupational History  . Not on file  Tobacco Use  . Smoking status: Never Smoker  . Smokeless tobacco: Never Used  Substance and Sexual Activity  . Alcohol use: No  . Drug use: No  . Sexual activity: Yes  Other Topics Concern  . Not on file  Social History Narrative  . Not on file   Penicillins   Prenatal Course: Uncomplicated except for UPJ dilation, see below.  Pt recently exposed to flu but has done prophylactic Tamiflu and had her flu shot.  ANT has been reassuring.   Prenatal Transfer Tool  Maternal Diabetes: No Genetic Screening: Normal Female NIPT Maternal Ultrasounds/Referrals: Abnormal:  Findings:   Fetal Kidney Anomalies Fetal Ultrasounds or other Referrals:  Referred to Materal Fetal Medicine   Fetal Pyelectasis: @ 18wk  RRP 8.4 mm and LRP 9.7. UTD A2-3 - MFM @ 20wk  R 16mm, L 14mm - MFM: EFW 2979  gm ( 6#9) 89% (AC.97%). AFI 18.17. Peristant bilateral UTD A2-3 (UPJ obstruction. 3.5 & 4 cm. OK for local delivery. Baby will likely go to NICU.  Maternal Substance Abuse:  No Significant Maternal Medications:  None Significant Maternal Lab Results: None  Vitals:   04/09/17 2001  BP: 115/71  Pulse: 86  Resp: 18  Temp: (!) 97.5 F (36.4 C)  TempSrc: Oral  SpO2: 100%  Weight: 203 lb (92.1 kg)  Height: 5\' 6"  (1.676 m)    Lungs/Cor:  NAD Abdomen:  soft, gravid Ex:  no cords, erythema SVE:  4/C/-2 FHTs:  NST R. REgular contractions.   A/P   For repeat cesarean sectionat term.  All risks, benefits and alternatives discussed with patient and she desires to proceed.  Will alert Neo to baby's urinary tract.    Seeley Southgate A

## 2017-04-09 NOTE — Anesthesia Procedure Notes (Signed)
Spinal  Patient location during procedure: OB Start time: 04/09/2017 10:53 PM End time: 04/09/2017 10:58 PM Staffing Anesthesiologist: Lowella CurbMiller, Warren Ray, MD Performed: anesthesiologist  Preanesthetic Checklist Completed: patient identified, surgical consent, pre-op evaluation, timeout performed, IV checked, risks and benefits discussed and monitors and equipment checked Spinal Block Patient position: sitting Prep: Betadine and site prepped and draped Patient monitoring: heart rate, cardiac monitor, continuous pulse ox and blood pressure Approach: midline Location: L3-4 Injection technique: single-shot Needle Needle type: Pencan  Needle gauge: 24 G Needle length: 10 cm Assessment Sensory level: T4

## 2017-04-09 NOTE — Brief Op Note (Signed)
04/09/2017  11:48 PM  PATIENT:  Monica Parrish  37 y.o. female  PRE-OPERATIVE DIAGNOSIS:  HX C/S  POST-OPERATIVE DIAGNOSIS:  HX C/S  PROCEDURE:  Procedure(s) with comments: CESAREAN SECTION (N/A) - RNFA PLEASE  SURGEON:  Surgeon(s) and Role: Carrington ClampMichelle Sharilyn Geisinger, MD  ANESTHESIA:   spinal  EBL:  756 mL   SPECIMEN:  No Specimen  DISPOSITION OF SPECIMEN:  N/A  COUNTS:  YES  TOURNIQUET:  * No tourniquets in log *  DICTATION: .Note written in EPIC  PLAN OF CARE: Admit to inpatient   PATIENT DISPOSITION:  PACU - hemodynamically stable.   Delay start of Pharmacological VTE agent (>24hrs) due to surgical blood loss or risk of bleeding: not applicable  Complications:  None. Medications:  Ancef, Pitocin Findings:  Baby female, Apgars 8,9, weight P.   Baby received 30 seconds of delayed cord clamp.  Normal tubes, ovaries and uterus seen.  Baby was skin to skin with mother after birth in the OR.  Baby voided 3 times after birth and was cleared by Neo to stay with mother in NBN.  Technique:  After adequate spinal anesthesia was achieved, the patient was prepped and draped in usual sterile fashion.  A foley catheter was used to drain the bladder.  A pfannanstiel incision was made with the scalpel and carried down to the fascia with the bovie cautery. The fascia was incised in the midline with the scalpel and carried in a transverse curvilinear manner bilaterally.  The fascia was reflected superiorly and inferiorly off the rectus muscles and the muscles split in the midline.  A bowel free portion of the peritoneum was entered bluntly and then extended in a superior and inferior manner with good visualization of the bowel and bladder.  The Alexis instrument was then placed and the vesico-uterine fascia tented up and incised in a transverse curvilinear manner.  A 2 cm transverse incision was made in the upper portion of the lower uterine segment until the amnion was exposed.   The incision was  extended transversely in a blunt manner.  Clear fluid was noted and the baby delivered in the vertex presentation without complication.  The baby was bulb suctioned and the cord was clamped and cut aftet stripping blood from cord into baby.  The baby was then handed to awaiting Neonatology.  The placenta was then delivered manually and the uterus cleared of all debris.  The uterine incision was then closed with a running lock stitch of 0 monocryl.  An imbricating layer of 0 monocryl was closed as well. Excellent hemostasis of the uterine incision was achieved and the abdomen was cleared with irrigation.  The peritoneum was closed with a running stitch of 2-0 vicryl.  This incorporated the rectus muscles as a separate layer.  The fascia was then closed with a running stitch of 0 vicryl.  The subcutaneous layer was closed with interrupted  stitches of 2-0 plain gut.  The skin was closed with 4-0 vicryl on a Keith needle and steri-strips.  The patient tolerated the procedure well and was returned to the recovery room in stable condition.  All counts were correct times three.  Dorella Laster A

## 2017-04-09 NOTE — Op Note (Signed)
04/09/2017  11:48 PM  PATIENT:  Monica Parrish  37 y.o. female  PRE-OPERATIVE DIAGNOSIS:  HX C/S  POST-OPERATIVE DIAGNOSIS:  HX C/S  PROCEDURE:  Procedure(s) with comments: CESAREAN SECTION (N/A) - RNFA PLEASE  SURGEON:  Surgeon(s) and Role: Georgie Haque, MD  ANESTHESIA:   spinal  EBL:  756 mL   SPECIMEN:  No Specimen  DISPOSITION OF SPECIMEN:  N/A  COUNTS:  YES  TOURNIQUET:  * No tourniquets in log *  DICTATION: .Note written in EPIC  PLAN OF CARE: Admit to inpatient   PATIENT DISPOSITION:  PACU - hemodynamically stable.   Delay start of Pharmacological VTE agent (>24hrs) due to surgical blood loss or risk of bleeding: not applicable  Complications:  None. Medications:  Ancef, Pitocin Findings:  Baby female, Apgars 8,9, weight P.   Baby received 30 seconds of delayed cord clamp.  Normal tubes, ovaries and uterus seen.  Baby was skin to skin with mother after birth in the OR.  Baby voided 3 times after birth and was cleared by Neo to stay with mother in NBN.  Technique:  After adequate spinal anesthesia was achieved, the patient was prepped and draped in usual sterile fashion.  A foley catheter was used to drain the bladder.  A pfannanstiel incision was made with the scalpel and carried down to the fascia with the bovie cautery. The fascia was incised in the midline with the scalpel and carried in a transverse curvilinear manner bilaterally.  The fascia was reflected superiorly and inferiorly off the rectus muscles and the muscles split in the midline.  A bowel free portion of the peritoneum was entered bluntly and then extended in a superior and inferior manner with good visualization of the bowel and bladder.  The Alexis instrument was then placed and the vesico-uterine fascia tented up and incised in a transverse curvilinear manner.  A 2 cm transverse incision was made in the upper portion of the lower uterine segment until the amnion was exposed.   The incision was  extended transversely in a blunt manner.  Clear fluid was noted and the baby delivered in the vertex presentation without complication.  The baby was bulb suctioned and the cord was clamped and cut aftet stripping blood from cord into baby.  The baby was then handed to awaiting Neonatology.  The placenta was then delivered manually and the uterus cleared of all debris.  The uterine incision was then closed with a running lock stitch of 0 monocryl.  An imbricating layer of 0 monocryl was closed as well. Excellent hemostasis of the uterine incision was achieved and the abdomen was cleared with irrigation.  The peritoneum was closed with a running stitch of 2-0 vicryl.  This incorporated the rectus muscles as a separate layer.  The fascia was then closed with a running stitch of 0 vicryl.  The subcutaneous layer was closed with interrupted  stitches of 2-0 plain gut.  The skin was closed with 4-0 vicryl on a Keith needle and steri-strips.  The patient tolerated the procedure well and was returned to the recovery room in stable condition.  All counts were correct times three.  Duron Meister A   

## 2017-04-09 NOTE — MAU Note (Signed)
Pt reports uc's since 1430 today. + FM, Denies LOF or VB

## 2017-04-09 NOTE — Anesthesia Preprocedure Evaluation (Signed)
Anesthesia Evaluation  Patient identified by MRN, date of birth, ID band Patient awake    Reviewed: Allergy & Precautions, NPO status , Patient's Chart, lab work & pertinent test results  Airway Mallampati: II  TM Distance: >3 FB Neck ROM: Full    Dental no notable dental hx.    Pulmonary neg pulmonary ROS,    Pulmonary exam normal breath sounds clear to auscultation       Cardiovascular negative cardio ROS Normal cardiovascular exam Rhythm:Regular Rate:Normal     Neuro/Psych negative neurological ROS  negative psych ROS   GI/Hepatic negative GI ROS, Neg liver ROS,   Endo/Other  negative endocrine ROS  Renal/GU negative Renal ROS  negative genitourinary   Musculoskeletal negative musculoskeletal ROS (+)   Abdominal   Peds negative pediatric ROS (+)  Hematology negative hematology ROS (+)   Anesthesia Other Findings   Reproductive/Obstetrics negative OB ROS (+) Pregnancy                             Anesthesia Physical Anesthesia Plan  ASA: II  Anesthesia Plan: Spinal   Post-op Pain Management:    Induction:   PONV Risk Score and Plan: 2 and Treatment may vary due to age or medical condition  Airway Management Planned: Natural Airway  Additional Equipment:   Intra-op Plan:   Post-operative Plan:   Informed Consent: I have reviewed the patients History and Physical, chart, labs and discussed the procedure including the risks, benefits and alternatives for the proposed anesthesia with the patient or authorized representative who has indicated his/her understanding and acceptance.   Dental advisory given  Plan Discussed with: CRNA  Anesthesia Plan Comments:         Anesthesia Quick Evaluation  

## 2017-04-09 NOTE — MAU Note (Signed)
Pt here with c/o contractions. Scheduled for a C/S on the 26th, repeat. Denies any bleeding or leaking of fluid. Reports fetal movement.

## 2017-04-09 NOTE — Consult Note (Signed)
Neonatology Note:   Attendance at C-section:    I was asked by Dr. Horvath to attend this repeat C/S at term. The mother is a G2P1, GBS not done with good prenatal care. Pregnancy complicated by bilateral severe hydronephrosis.  Normal fluid levels.  MFM and Urology involved.  ROM at delivery, fluid clear. +void.  30sec DCC.  Infant vigorous with good spontaneous cry and tone. Needed only minimal bulb suctioning. Ap 8/9. Lungs clear to ausc in DR. Voided again on warmer.  To CN to care of Pediatrician. Monitor for appropriate voiding.  Obtain BMP and RUS in 1-2 days.  Per Urology recommendations, begin prophylactic Amoxicillin. I d/w parents and staff.    Feel free to contact NICU if any questions or concerns.   Monica Coudriet C. Keneth Borg, MD 

## 2017-04-10 ENCOUNTER — Encounter (HOSPITAL_COMMUNITY): Payer: Self-pay

## 2017-04-10 DIAGNOSIS — Z9889 Other specified postprocedural states: Secondary | ICD-10-CM

## 2017-04-10 LAB — CBC
HCT: 31.1 % — ABNORMAL LOW (ref 36.0–46.0)
HEMOGLOBIN: 10.6 g/dL — AB (ref 12.0–15.0)
MCH: 28.6 pg (ref 26.0–34.0)
MCHC: 34.1 g/dL (ref 30.0–36.0)
MCV: 84.1 fL (ref 78.0–100.0)
PLATELETS: 225 10*3/uL (ref 150–400)
RBC: 3.7 MIL/uL — ABNORMAL LOW (ref 3.87–5.11)
RDW: 14.3 % (ref 11.5–15.5)
WBC: 17.1 10*3/uL — ABNORMAL HIGH (ref 4.0–10.5)

## 2017-04-10 LAB — RPR: RPR: NONREACTIVE

## 2017-04-10 MED ORDER — METHYLERGONOVINE MALEATE 0.2 MG PO TABS: 0.2000 mg | ORAL_TABLET | ORAL | Status: DC | PRN

## 2017-04-10 MED ORDER — SIMETHICONE 80 MG PO CHEW
80.0000 mg | CHEWABLE_TABLET | Freq: Three times a day (TID) | ORAL | Status: DC
  Administered 2017-04-10 – 2017-04-11 (×4): 80 mg via ORAL
  Filled 2017-04-10 (×4): qty 1

## 2017-04-10 MED ORDER — LACTATED RINGERS IV SOLN: INTRAVENOUS | Status: DC

## 2017-04-10 MED ORDER — OXYTOCIN 40 UNITS IN LACTATED RINGERS INFUSION - SIMPLE MED: 2.5000 [IU]/h | INTRAVENOUS | Status: AC

## 2017-04-10 MED ORDER — NALBUPHINE HCL 10 MG/ML IJ SOLN: 5.0000 mg | Freq: Once | INTRAMUSCULAR | Status: DC | PRN

## 2017-04-10 MED ORDER — DIPHENHYDRAMINE HCL 25 MG PO CAPS
25.0000 mg | ORAL_CAPSULE | ORAL | Status: DC | PRN
  Filled 2017-04-10: qty 1

## 2017-04-10 MED ORDER — NALOXONE HCL 0.4 MG/ML IJ SOLN
0.4000 mg | INTRAMUSCULAR | Status: DC | PRN
Start: 2017-04-10 — End: 2017-04-11

## 2017-04-10 MED ORDER — NALOXONE HCL 4 MG/10ML IJ SOLN: 1.0000 ug/kg/h | INTRAVENOUS | Status: DC | PRN

## 2017-04-10 MED ORDER — MEASLES, MUMPS & RUBELLA VAC ~~LOC~~ INJ: 0.5000 mL | INJECTION | Freq: Once | SUBCUTANEOUS | Status: DC

## 2017-04-10 MED ORDER — ONDANSETRON HCL 4 MG/2ML IJ SOLN: 4.0000 mg | Freq: Three times a day (TID) | INTRAMUSCULAR | Status: DC | PRN

## 2017-04-10 MED ORDER — PRENATAL MULTIVITAMIN CH
1.0000 | ORAL_TABLET | Freq: Every day | ORAL | Status: DC
  Administered 2017-04-10 – 2017-04-11 (×2): 1 via ORAL
  Filled 2017-04-10 (×2): qty 1

## 2017-04-10 MED ORDER — METHYLERGONOVINE MALEATE 0.2 MG/ML IJ SOLN: 0.2000 mg | INTRAMUSCULAR | Status: DC | PRN

## 2017-04-10 MED ORDER — OXYCODONE HCL 5 MG PO TABS
5.0000 mg | ORAL_TABLET | ORAL | Status: DC | PRN
  Administered 2017-04-11: 5 mg via ORAL
  Filled 2017-04-10: qty 1

## 2017-04-10 MED ORDER — NALBUPHINE HCL 10 MG/ML IJ SOLN: 5.0000 mg | INTRAMUSCULAR | Status: DC | PRN

## 2017-04-10 MED ORDER — DIBUCAINE 1 % RE OINT: 1.0000 "application " | TOPICAL_OINTMENT | RECTAL | Status: DC | PRN

## 2017-04-10 MED ORDER — OXYCODONE HCL 5 MG PO TABS: 10.0000 mg | ORAL_TABLET | ORAL | Status: DC | PRN

## 2017-04-10 MED ORDER — IBUPROFEN 800 MG PO TABS
800.0000 mg | ORAL_TABLET | Freq: Three times a day (TID) | ORAL | Status: DC
  Administered 2017-04-10 – 2017-04-11 (×4): 800 mg via ORAL
  Filled 2017-04-10 (×5): qty 1

## 2017-04-10 MED ORDER — SODIUM CHLORIDE 0.9% FLUSH: 3.0000 mL | INTRAVENOUS | Status: DC | PRN

## 2017-04-10 MED ORDER — MENTHOL 3 MG MT LOZG: 1.0000 | LOZENGE | OROMUCOSAL | Status: DC | PRN

## 2017-04-10 MED ORDER — SIMETHICONE 80 MG PO CHEW
80.0000 mg | CHEWABLE_TABLET | ORAL | Status: DC
  Administered 2017-04-10 – 2017-04-11 (×2): 80 mg via ORAL
  Filled 2017-04-10 (×2): qty 1

## 2017-04-10 MED ORDER — COCONUT OIL OIL: 1.0000 "application " | TOPICAL_OIL | Status: DC | PRN

## 2017-04-10 MED ORDER — SENNOSIDES-DOCUSATE SODIUM 8.6-50 MG PO TABS
2.0000 | ORAL_TABLET | ORAL | Status: DC
  Administered 2017-04-10 – 2017-04-11 (×2): 2 via ORAL
  Filled 2017-04-10 (×2): qty 2

## 2017-04-10 MED ORDER — ACETAMINOPHEN 325 MG PO TABS: 650.0000 mg | ORAL_TABLET | ORAL | Status: DC | PRN

## 2017-04-10 MED ORDER — TETANUS-DIPHTH-ACELL PERTUSSIS 5-2.5-18.5 LF-MCG/0.5 IM SUSP: 0.5000 mL | Freq: Once | INTRAMUSCULAR | Status: DC

## 2017-04-10 MED ORDER — BISACODYL 10 MG RE SUPP: 10.0000 mg | Freq: Every day | RECTAL | Status: DC | PRN

## 2017-04-10 MED ORDER — WITCH HAZEL-GLYCERIN EX PADS: 1.0000 "application " | MEDICATED_PAD | CUTANEOUS | Status: DC | PRN

## 2017-04-10 MED ORDER — DIPHENHYDRAMINE HCL 25 MG PO CAPS
25.0000 mg | ORAL_CAPSULE | Freq: Four times a day (QID) | ORAL | Status: DC | PRN
  Administered 2017-04-10: 25 mg via ORAL

## 2017-04-10 MED ORDER — ZOLPIDEM TARTRATE 5 MG PO TABS: 5.0000 mg | ORAL_TABLET | Freq: Every evening | ORAL | Status: DC | PRN

## 2017-04-10 MED ORDER — SIMETHICONE 80 MG PO CHEW: 80.0000 mg | CHEWABLE_TABLET | ORAL | Status: DC | PRN

## 2017-04-10 MED ORDER — SCOPOLAMINE 1 MG/3DAYS TD PT72: 1.0000 | MEDICATED_PATCH | Freq: Once | TRANSDERMAL | Status: DC

## 2017-04-10 MED ORDER — FLEET ENEMA 7-19 GM/118ML RE ENEM: 1.0000 | ENEMA | Freq: Every day | RECTAL | Status: DC | PRN

## 2017-04-10 MED ORDER — DIPHENHYDRAMINE HCL 50 MG/ML IJ SOLN: 12.5000 mg | INTRAMUSCULAR | Status: DC | PRN

## 2017-04-10 MED ORDER — FAMOTIDINE 20 MG PO TABS
20.0000 mg | ORAL_TABLET | Freq: Two times a day (BID) | ORAL | Status: DC
  Administered 2017-04-10 – 2017-04-11 (×3): 20 mg via ORAL
  Filled 2017-04-10 (×3): qty 1

## 2017-04-10 MED ORDER — FERROUS SULFATE 325 (65 FE) MG PO TABS
325.0000 mg | ORAL_TABLET | Freq: Two times a day (BID) | ORAL | Status: DC
  Administered 2017-04-10 – 2017-04-11 (×3): 325 mg via ORAL
  Filled 2017-04-10 (×3): qty 1

## 2017-04-10 NOTE — Anesthesia Postprocedure Evaluation (Signed)
Anesthesia Post Note  Patient: Monica Parrish  Procedure(s) Performed: CESAREAN SECTION (N/A )     Patient location during evaluation: PACU Anesthesia Type: Spinal Level of consciousness: oriented and awake and alert Pain management: pain level controlled Vital Signs Assessment: post-procedure vital signs reviewed and stable Respiratory status: spontaneous breathing and respiratory function stable Cardiovascular status: blood pressure returned to baseline and stable Postop Assessment: no headache, no backache and no apparent nausea or vomiting Anesthetic complications: no    Last Vitals:  Vitals:   04/10/17 0045 04/10/17 0100  BP: 100/64 (!) 102/57  Pulse: 61 61  Resp: 19 12  Temp:    SpO2: 97% 99%    Last Pain:  Vitals:   04/10/17 0030  TempSrc: Axillary  PainSc:    Pain Goal:                 Lowella CurbWarren Ray Brigitte Soderberg

## 2017-04-10 NOTE — Transfer of Care (Signed)
Immediate Anesthesia Transfer of Care Note  Patient: Monica Parrish  Procedure(s) Performed: CESAREAN SECTION (N/A )  Patient Location: PACU  Anesthesia Type:Spinal  Level of Consciousness: awake, alert , oriented and patient cooperative  Airway & Oxygen Therapy: Patient Spontanous Breathing  Post-op Assessment: Report given to RN and Post -op Vital signs reviewed and stable  Post vital signs: Reviewed and stable  Last Vitals:  Vitals:   04/09/17 2001  BP: 115/71  Pulse: 86  Resp: 18  Temp: (!) 36.4 C  SpO2: 100%    Last Pain:  Vitals:   04/09/17 2056  TempSrc:   PainSc: 6          Complications: No apparent anesthesia complications

## 2017-04-10 NOTE — Progress Notes (Signed)
Patient is doing well.  She is tolerating PO.  Foley catheter in place, has not yet ambulated.  Pain is controlled.  Lochia is appropriate  Vitals:   04/10/17 0138 04/10/17 0232 04/10/17 0328 04/10/17 0427  BP: 97/62 94/64 96/64  95/60  Pulse: (!) 57 61 64 (!) 56  Resp: 16 16 16 16   Temp: 97.8 F (36.6 C) 98 F (36.7 C) 98.3 F (36.8 C) 98.5 F (36.9 C)  TempSrc: Axillary Oral Oral Oral  SpO2: 96% 94% 95% 95%  Weight:      Height:        NAD Abdomen:  soft, appropriate tenderness, + pressure dressing, no drainage ext: +SCDs  Lab Results  Component Value Date   WBC 17.1 (H) 04/10/2017   HGB 10.6 (L) 04/10/2017   HCT 31.1 (L) 04/10/2017   MCV 84.1 04/10/2017   PLT 225 04/10/2017    --/--/A NEG (02/17 2135)/RI  A/P    36 y.o. G2P2002 POD 1 s/p RCS Routine post op and postpartum care.   Remove foley, ambulate Rh neg: baby Rh positive--rhogam work up pending Circ: baby with bilateral severe hydronephrosis--awaiting H&P/clearance from peds for circ

## 2017-04-10 NOTE — Lactation Note (Signed)
This note was copied from a baby's chart. Lactation Consultation Note  Patient Name: Boy Sharlette DenseKarla Shorts WUJWJ'XToday's Date: 04/10/2017 Reason for consult: Initial assessment   P2, Baby 16 hours old and sleepy.  Mother holding baby STS upon entering. Mother states she has history of low milk supply with no medical history indicating reason. Reviewed hand expression w/ good flow of colostrum. Spoon fed baby approx 5 ml of colostrum. Mother latched baby briefly in cradle hold.  Recommend extra pillows for support. Suggest spoon feeding with each feeding and attempt. If baby remains sleepy suggest mother start pumping w/ DEBP. Recommend mother post pump 4-6 times per day for 10-20 min with DEBP on initiation setting. Give baby back volume pumped at the next feeding. Reviewed cleaning and milk storage.  Mom encouraged to feed baby 8-12 times/24 hours and with feeding cues.  Mom made aware of O/P services, breastfeeding support groups, community resources, and our phone # for post-discharge questions.       Maternal Data Has patient been taught Hand Expression?: Yes Does the patient have breastfeeding experience prior to this delivery?: Yes  Feeding Feeding Type: Breast Fed Length of feed: (a few sucks)  LATCH Score Latch: Repeated attempts needed to sustain latch, nipple held in mouth throughout feeding, stimulation needed to elicit sucking reflex.  Audible Swallowing: None  Type of Nipple: Everted at rest and after stimulation  Comfort (Breast/Nipple): Soft / non-tender  Hold (Positioning): Assistance needed to correctly position infant at breast and maintain latch.  LATCH Score: 6  Interventions Interventions: Assisted with latch;DEBP;Hand express  Lactation Tools Discussed/Used Pump Review: Setup, frequency, and cleaning;Milk Storage Initiated by:: Dahlia Byesuth Berkelhammer RN IBCLC Date initiated:: 04/10/17   Consult Status Consult Status: Follow-up Date: 04/11/17 Follow-up  type: In-patient    Dahlia ByesBerkelhammer, Ruth [facility] LPBoschen 04/10/2017, 3:30 PM

## 2017-04-10 NOTE — Addendum Note (Signed)
Addendum  created 04/10/17 0748 by Shanon PayorGregory, Britanni Yarde M, CRNA   Sign clinical note

## 2017-04-10 NOTE — Anesthesia Postprocedure Evaluation (Signed)
Anesthesia Post Note  Patient: Monica Parrish  Procedure(s) Performed: CESAREAN SECTION (N/A )     Patient location during evaluation: Mother Baby Anesthesia Type: Spinal Level of consciousness: awake and alert and oriented Pain management: satisfactory to patient Vital Signs Assessment: post-procedure vital signs reviewed and stable Respiratory status: spontaneous breathing and nonlabored ventilation Cardiovascular status: stable Postop Assessment: no headache, no backache, patient able to bend at knees, no signs of nausea or vomiting and adequate PO intake Anesthetic complications: no    Last Vitals:  Vitals:   04/10/17 0328 04/10/17 0427  BP: 96/64 95/60  Pulse: 64 (!) 56  Resp: 16 16  Temp: 36.8 C 36.9 C  SpO2: 95% 95%    Last Pain:  Vitals:   04/10/17 0611  TempSrc:   PainSc: 1    Pain Goal:                 Madison HickmanGREGORY,[name]

## 2017-04-11 LAB — BIRTH TISSUE RECOVERY COLLECTION (PLACENTA DONATION)

## 2017-04-11 MED ORDER — RHO D IMMUNE GLOBULIN 1500 UNIT/2ML IJ SOSY
300.0000 ug | PREFILLED_SYRINGE | Freq: Once | INTRAMUSCULAR | Status: AC
  Administered 2017-04-11: 300 ug via INTRAVENOUS
  Filled 2017-04-11: qty 2

## 2017-04-11 MED ORDER — OXYCODONE HCL 5 MG PO TABS: 5.0000 mg | ORAL_TABLET | ORAL | 0 refills | Status: DC | PRN

## 2017-04-11 NOTE — Discharge Summary (Signed)
Obstetric Discharge Summary Reason for Admission: onset of labor Prenatal Procedures: none Intrapartum Procedures: cesarean: low cervical, transverse Postpartum Procedures: none Complications-Operative and Postpartum: none Hemoglobin  Date Value Ref Range Status  04/10/2017 10.6 (L) 12.0 - 15.0 g/dL Final   HCT  Date Value Ref Range Status  04/10/2017 31.1 (L) 36.0 - 46.0 % Final    Physical Exam:  General: alert and cooperative Lochia: appropriate Uterine Fundus: firm Incision: healing well, no significant drainage DVT Evaluation: No evidence of DVT seen on physical exam.  Discharge Diagnoses: Term Pregnancy-delivered  Discharge Information: Date: 04/11/2017 Activity: pelvic rest Diet: routine Medications: PNV, Ibuprofen and Percocet Condition: stable Instructions: refer to practice specific booklet Discharge to: home Follow-up Information    Carrington ClampHorvath, Michelle, MD Follow up in 3 week(s).   Specialty:  Obstetrics and Gynecology Contact information: [address] [place] [address] [phone]           Newborn Data: Live born female  Birth Weight: 8 lb 0.8 oz (3650 g) APGAR: 8, 9  Newborn Delivery   Birth date/time:  04/09/2017 23:14:00 Delivery type:  C-Section, Low Vertical C-section categorization:  Repeat     Home with mother.  Philip AspenCALLAHAN, Harjas Biggins 04/11/2017, 10:13 PM

## 2017-04-12 LAB — RH IG WORKUP (INCLUDES ABO/RH)
ABO/RH(D): A NEG
Fetal Screen: NEGATIVE
GESTATIONAL AGE(WKS): 38
Unit division: 0

## 2017-04-13 LAB — TYPE AND SCREEN
ABO/RH(D): A NEG
ANTIBODY SCREEN: POSITIVE
Unit division: 0
Unit division: 0

## 2017-04-13 LAB — BPAM RBC
BLOOD PRODUCT EXPIRATION DATE: 201903152359
Blood Product Expiration Date: 201903022359
Unit Type and Rh: 600
Unit Type and Rh: 600

## 2017-04-17 ENCOUNTER — Encounter (HOSPITAL_COMMUNITY)
Admission: RE | Admit: 2017-04-17 | Discharge: 2017-04-17 | Disposition: A | Discharge status: Home / Self Care | Payer: BC Managed Care – PPO | Source: Ambulatory Visit

## 2017-04-18 ENCOUNTER — Inpatient Hospital Stay (HOSPITAL_COMMUNITY)
Admission: RE | Admit: 2017-04-18 | Payer: BC Managed Care – PPO | Source: Ambulatory Visit | Admitting: Obstetrics and Gynecology

## 2018-03-28 DIAGNOSIS — D225 Melanocytic nevi of trunk: Secondary | ICD-10-CM | POA: Diagnosis not present

## 2018-03-28 DIAGNOSIS — D2261 Melanocytic nevi of right upper limb, including shoulder: Secondary | ICD-10-CM | POA: Diagnosis not present

## 2018-03-28 DIAGNOSIS — D224 Melanocytic nevi of scalp and neck: Secondary | ICD-10-CM | POA: Diagnosis not present

## 2018-03-28 DIAGNOSIS — L814 Other melanin hyperpigmentation: Secondary | ICD-10-CM | POA: Diagnosis not present

## 2018-04-04 DIAGNOSIS — F419 Anxiety disorder, unspecified: Secondary | ICD-10-CM | POA: Diagnosis not present

## 2018-04-25 ENCOUNTER — Other Ambulatory Visit: Payer: Self-pay | Admitting: Internal Medicine

## 2018-04-25 ENCOUNTER — Ambulatory Visit
Admission: RE | Admit: 2018-04-25 | Discharge: 2018-04-25 | Disposition: A | Discharge status: Home / Self Care | Payer: BLUE CROSS/BLUE SHIELD | Source: Ambulatory Visit | Attending: Internal Medicine | Admitting: Internal Medicine

## 2018-04-25 DIAGNOSIS — R0602 Shortness of breath: Secondary | ICD-10-CM

## 2018-04-25 DIAGNOSIS — R079 Chest pain, unspecified: Secondary | ICD-10-CM

## 2018-04-25 DIAGNOSIS — F419 Anxiety disorder, unspecified: Secondary | ICD-10-CM | POA: Diagnosis not present

## 2018-04-25 DIAGNOSIS — R0600 Dyspnea, unspecified: Secondary | ICD-10-CM | POA: Diagnosis not present

## 2018-04-25 MED ORDER — IOPAMIDOL (ISOVUE-370) INJECTION 76%
75.0000 mL | Freq: Once | INTRAVENOUS | Status: AC | PRN
  Administered 2018-04-25: 75 mL via INTRAVENOUS

## 2018-06-09 IMAGING — US US MFM OB FOLLOW-UP
1 series · 14 of 28 positions shown · non-contrast
Comparison: none

[Series 1: us mfm ob follow-up · 56 acquisitions, 14 frames shown]
[im 3/56]
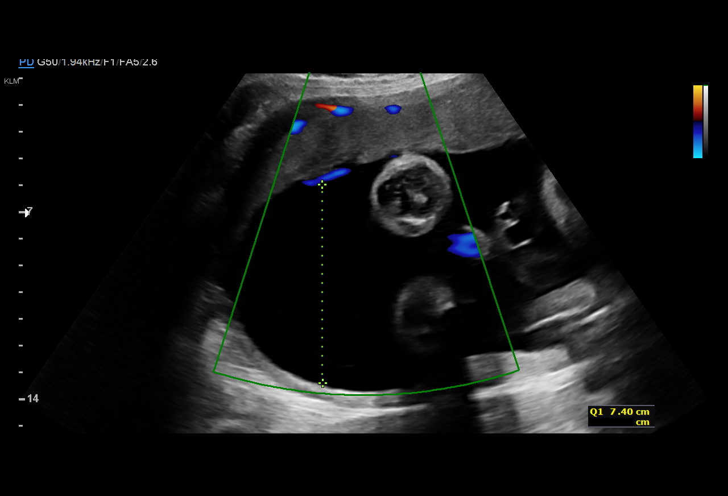
[im 7/56]
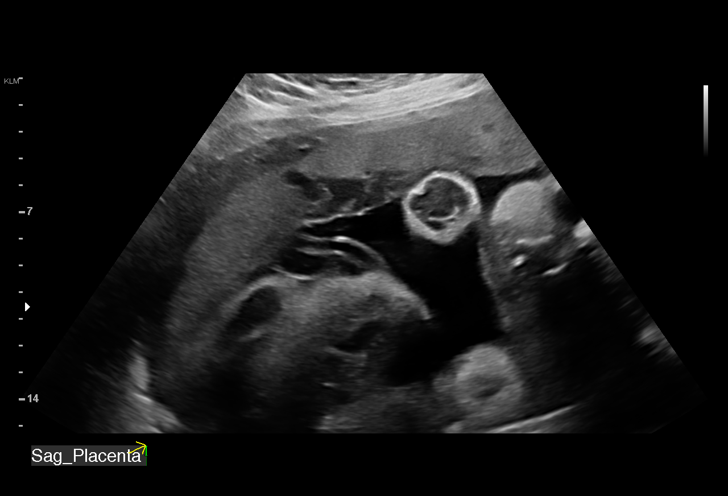
[im 11/56]
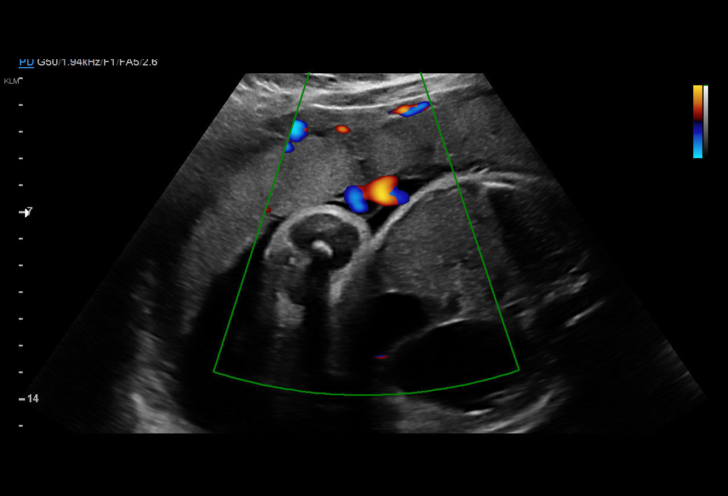
[im 15/56]
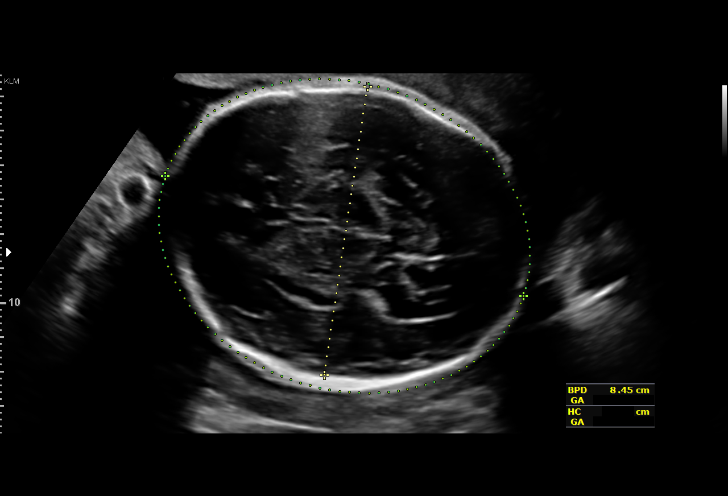
[im 19/56]
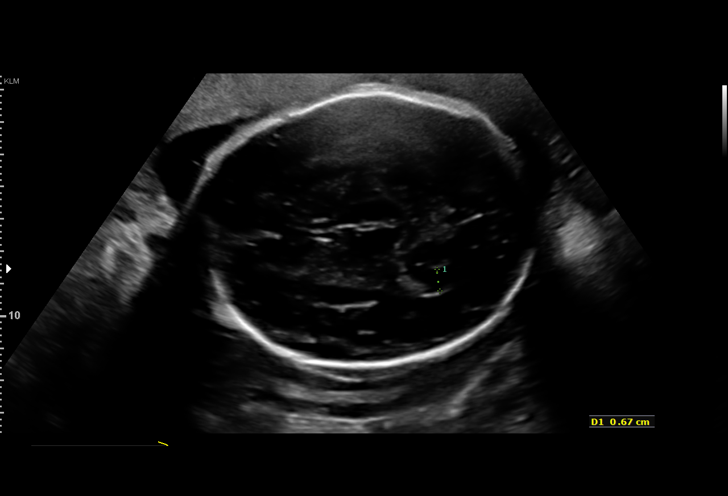
[im 23/56]
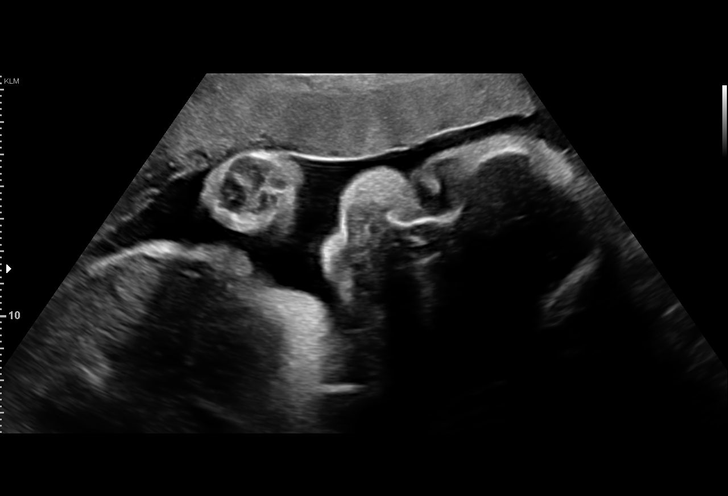
[im 27/56]
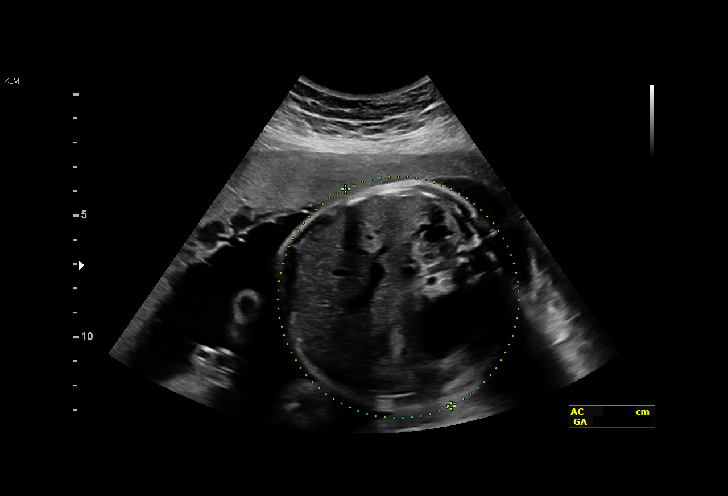
[im 31/56]
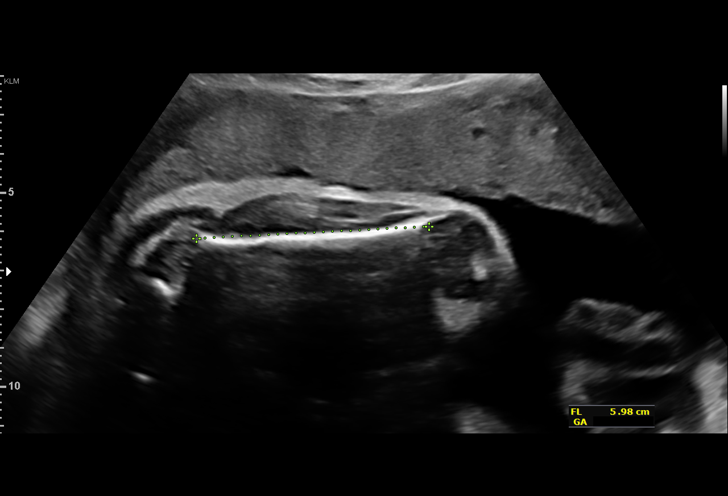
[im 35/56]
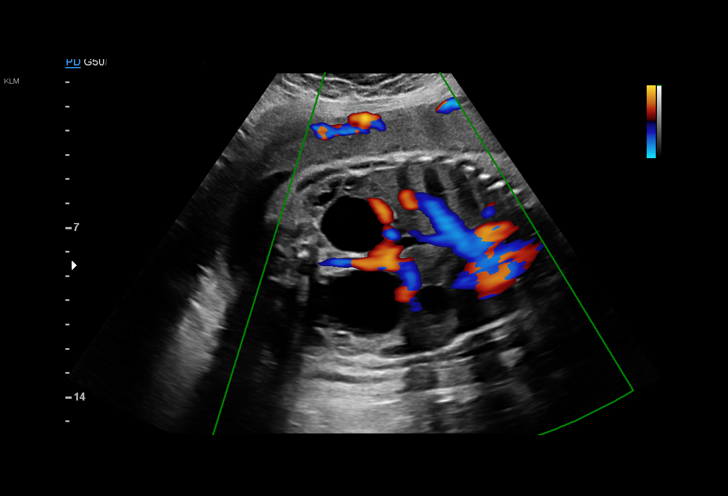
[im 39/56]
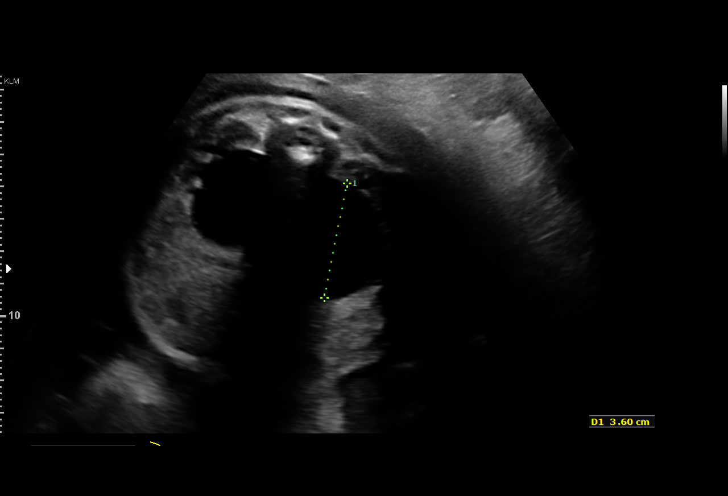
[im 43/56]
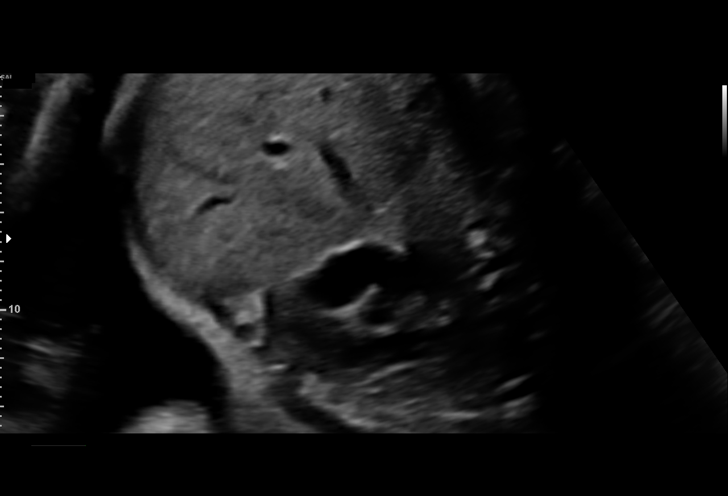
[im 47/56]
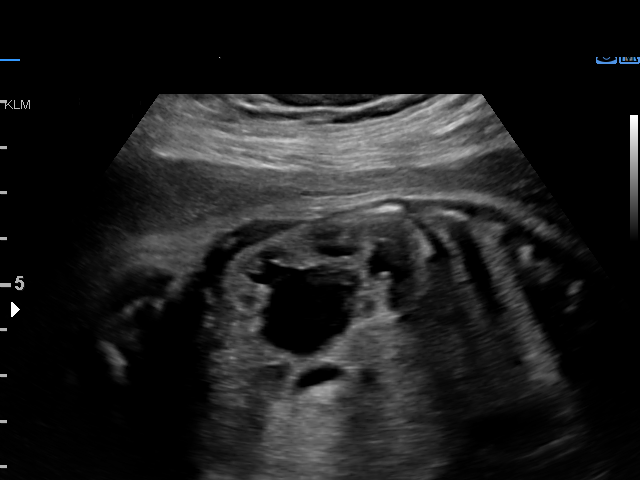
[im 51/56]
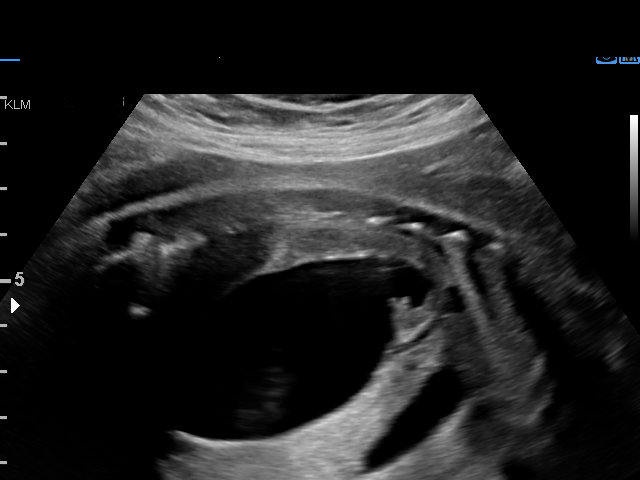
[im 56/56]
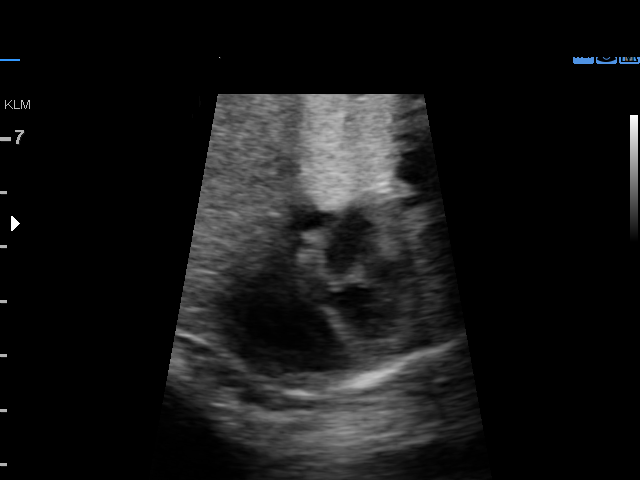

[14 of 28 positions shown; findings below may reference images not displayed]

1  RINAT POP            [id number]      [id number]     [id number]
Indications

30 weeks gestation of pregnancy
Fetal abnormality - other known or
suspected; bilateral UTD A2-3; suspect UPJ
obstruction
Previous cesarean delivery, antepartum
Advanced maternal age multigravida 35+,
third trimester (low risk NIPS)
OB History

Gravidity:    2         Term:   1        Prem:   0        SAB:   0
TOP:          0       Ectopic:  0        Living: 1
Fetal Evaluation

Num Of Fetuses:     1
Fetal Heart         148
Rate(bpm):
Cardiac Activity:   Observed
Presentation:       Cephalic
Placenta:           Anterior, above cervical os
P. Cord Insertion:  Visualized

Amniotic Fluid
AFI FV:      Subjectively within normal limits

AFI Sum(cm)     %Tile       Largest Pocket(cm)
20.99           82

RUQ(cm)       RLQ(cm)       LUQ(cm)        LLQ(cm)
7.4
Biometry

BPD:      83.5  mm     G. Age:  33w 4d         98  %    CI:         73.9   %    70 - 86
FL/HC:      19.6   %    19.3 -
HC:      308.5  mm     G. Age:  34w 3d         96  %    HC/AC:      1.01        0.96 -
AC:      305.9  mm     G. Age:  34w 4d       > 97  %    FL/BPD:     72.6   %    71 - 87
FL:       60.6  mm     G. Age:  31w 4d         58  %    FL/AC:      19.8   %    20 - 24

Est. FW:    0020  gm    4 lb 15 oz      90  %
Gestational Age

U/S Today:     33w 4d                                        EDD:   04/01/17
Best:          30w 5d     Det. By:  Early Ultrasound         EDD:   04/21/17
(09/06/16)
Anatomy

Cranium:               Appears normal         Aortic Arch:            Appears normal
Cavum:                 Appears normal         Ductal Arch:            Previously seen
Ventricles:            Appears normal         Diaphragm:              Appears normal
Choroid Plexus:        Appears normal         Stomach:                Appears normal, left
sided
Cerebellum:            Appears normal         Abdomen:                Appears normal
Posterior Fossa:       Appears normal         Abdominal Wall:         Previously seen
Nuchal Fold:           Not applicable (>20    Cord Vessels:           Not well visualized
wks GA)
Face:                  Orbits and profile     Kidneys:                UTD A2, Rt 2.9cm,
previously seen
Lt 3.6cm
Lips:                  Appears normal         Bladder:                Appears normal
Thoracic:              Appears normal         Spine:                  Previously seen
Heart:                 Previously seen        Upper Extremities:      Previously seen
RVOT:                  Appears normal         Lower Extremities:      Previously seen
LVOT:                  Appears normal

Other:  Fetus appears to be a male.Heels and LT  5th digit previously
seen.Technically difficult due to fetal position.
Cervix Uterus Adnexa

Cervix
Not visualized (advanced GA >53wks)
Impression

SIUP at 30+5 weeks
Bilateral UTD A2-3; severe, bilateral renal pelvis and calyceal
dilation; thinning of parenchyma but not echogenic or cystic;
no ureters identified; bladder appeared to be normal
All other interval fetal anatomy was seen and appeared
normal; anatomic survey complete
Normal amniotic fluid volume
EFW at the 90th %tile; AC > 97th %tile; fetus at risk to be
LGA
Recommendations

Follow-up US and pediatric urology consultation 03/14/17

## 2018-10-22 DIAGNOSIS — E559 Vitamin D deficiency, unspecified: Secondary | ICD-10-CM | POA: Diagnosis not present

## 2018-10-22 DIAGNOSIS — Z0000 Encounter for general adult medical examination without abnormal findings: Secondary | ICD-10-CM | POA: Diagnosis not present

## 2018-10-31 DIAGNOSIS — Z23 Encounter for immunization: Secondary | ICD-10-CM | POA: Diagnosis not present

## 2018-10-31 DIAGNOSIS — Z800 Family history of malignant neoplasm of digestive organs: Secondary | ICD-10-CM | POA: Diagnosis not present

## 2018-10-31 DIAGNOSIS — Z0000 Encounter for general adult medical examination without abnormal findings: Secondary | ICD-10-CM | POA: Diagnosis not present

## 2018-10-31 DIAGNOSIS — F419 Anxiety disorder, unspecified: Secondary | ICD-10-CM | POA: Diagnosis not present

## 2018-10-31 DIAGNOSIS — G43909 Migraine, unspecified, not intractable, without status migrainosus: Secondary | ICD-10-CM | POA: Diagnosis not present

## 2019-01-11 DIAGNOSIS — Z20828 Contact with and (suspected) exposure to other viral communicable diseases: Secondary | ICD-10-CM | POA: Diagnosis not present

## 2019-04-10 DIAGNOSIS — F418 Other specified anxiety disorders: Secondary | ICD-10-CM | POA: Diagnosis not present

## 2019-04-25 DIAGNOSIS — F418 Other specified anxiety disorders: Secondary | ICD-10-CM | POA: Diagnosis not present

## 2019-05-18 ENCOUNTER — Ambulatory Visit: Payer: BC Managed Care – PPO | Attending: Internal Medicine

## 2019-05-18 DIAGNOSIS — Z23 Encounter for immunization: Secondary | ICD-10-CM

## 2019-05-18 NOTE — Progress Notes (Signed)
   Covid-19 Vaccination Clinic  Name:  Alawna Graybeal    MRN: [id number] DOB: Jun 29, 1980  05/18/2019  Ms. Lasker was observed post Covid-19 immunization for 15 minutes without incident. She was provided with Vaccine Information Sheet and instruction to access the V-Safe system.   Ms. Broden was instructed to call 911 with any severe reactions post vaccine: Marland Kitchen Difficulty breathing  . Swelling of face and throat  . A fast heartbeat  . A bad rash all over body  . Dizziness and weakness   Immunizations Administered    Name Date Dose VIS Date Route   Pfizer COVID-19 Vaccine 05/18/2019  2:47 PM 0.3 mL 02/01/2019 Intramuscular   Manufacturer: [employer], [employer]   Lot: [id number]   NDC: [address]-4

## 2019-06-11 ENCOUNTER — Ambulatory Visit: Payer: BC Managed Care – PPO | Attending: Internal Medicine

## 2019-06-11 DIAGNOSIS — Z23 Encounter for immunization: Secondary | ICD-10-CM

## 2019-06-11 NOTE — Progress Notes (Signed)
   Covid-19 Vaccination Clinic  Name:  Monica Parrish    MRN: [id number] DOB: 05-29-1980  06/11/2019  Ms. Clendenning was observed post Covid-19 immunization for 15 minutes without incident. She was provided with Vaccine Information Sheet and instruction to access the V-Safe system.   Ms. Bellizzi was instructed to call 911 with any severe reactions post vaccine: Marland Kitchen Difficulty breathing  . Swelling of face and throat  . A fast heartbeat  . A bad rash all over body  . Dizziness and weakness   Immunizations Administered    Name Date Dose VIS Date Route   Pfizer COVID-19 Vaccine 06/11/2019 11:00 AM 0.3 mL 04/17/2018 Intramuscular   Manufacturer: [employer], [employer]   Lot: [id number]   NDC: [address]-0

## 2019-08-21 DIAGNOSIS — M47812 Spondylosis without myelopathy or radiculopathy, cervical region: Secondary | ICD-10-CM | POA: Diagnosis not present

## 2019-08-21 DIAGNOSIS — M542 Cervicalgia: Secondary | ICD-10-CM | POA: Diagnosis not present

## 2019-08-21 DIAGNOSIS — M40202 Unspecified kyphosis, cervical region: Secondary | ICD-10-CM | POA: Diagnosis not present

## 2019-08-21 DIAGNOSIS — M9901 Segmental and somatic dysfunction of cervical region: Secondary | ICD-10-CM | POA: Diagnosis not present

## 2020-01-06 DIAGNOSIS — F418 Other specified anxiety disorders: Secondary | ICD-10-CM | POA: Diagnosis not present

## 2020-04-09 DIAGNOSIS — F419 Anxiety disorder, unspecified: Secondary | ICD-10-CM | POA: Diagnosis not present

## 2020-04-09 DIAGNOSIS — E559 Vitamin D deficiency, unspecified: Secondary | ICD-10-CM | POA: Diagnosis not present

## 2020-04-09 DIAGNOSIS — Z0000 Encounter for general adult medical examination without abnormal findings: Secondary | ICD-10-CM | POA: Diagnosis not present

## 2020-04-13 DIAGNOSIS — R748 Abnormal levels of other serum enzymes: Secondary | ICD-10-CM | POA: Diagnosis not present

## 2020-04-15 DIAGNOSIS — R748 Abnormal levels of other serum enzymes: Secondary | ICD-10-CM | POA: Diagnosis not present

## 2020-04-15 DIAGNOSIS — Z6832 Body mass index (BMI) 32.0-32.9, adult: Secondary | ICD-10-CM | POA: Diagnosis not present

## 2020-04-15 DIAGNOSIS — E6609 Other obesity due to excess calories: Secondary | ICD-10-CM | POA: Diagnosis not present

## 2020-04-15 DIAGNOSIS — Z0000 Encounter for general adult medical examination without abnormal findings: Secondary | ICD-10-CM | POA: Diagnosis not present

## 2020-04-27 DIAGNOSIS — R748 Abnormal levels of other serum enzymes: Secondary | ICD-10-CM | POA: Diagnosis not present

## 2020-05-07 DIAGNOSIS — R748 Abnormal levels of other serum enzymes: Secondary | ICD-10-CM | POA: Diagnosis not present

## 2020-05-07 DIAGNOSIS — R749 Abnormal serum enzyme level, unspecified: Secondary | ICD-10-CM | POA: Diagnosis not present

## 2020-07-08 DIAGNOSIS — Z01419 Encounter for gynecological examination (general) (routine) without abnormal findings: Secondary | ICD-10-CM | POA: Diagnosis not present

## 2020-07-14 DIAGNOSIS — K219 Gastro-esophageal reflux disease without esophagitis: Secondary | ICD-10-CM | POA: Diagnosis not present

## 2020-07-14 DIAGNOSIS — R748 Abnormal levels of other serum enzymes: Secondary | ICD-10-CM | POA: Diagnosis not present

## 2020-07-14 DIAGNOSIS — Z1211 Encounter for screening for malignant neoplasm of colon: Secondary | ICD-10-CM | POA: Diagnosis not present

## 2020-07-14 DIAGNOSIS — Z800 Family history of malignant neoplasm of digestive organs: Secondary | ICD-10-CM | POA: Diagnosis not present

## 2020-10-22 ENCOUNTER — Other Ambulatory Visit: Payer: Self-pay

## 2020-10-22 ENCOUNTER — Ambulatory Visit (INDEPENDENT_AMBULATORY_CARE_PROVIDER_SITE_OTHER): Payer: BC Managed Care – PPO

## 2020-10-22 ENCOUNTER — Encounter: Payer: Self-pay | Admitting: Podiatry

## 2020-10-22 ENCOUNTER — Ambulatory Visit (INDEPENDENT_AMBULATORY_CARE_PROVIDER_SITE_OTHER): Payer: BC Managed Care – PPO | Admitting: Podiatry

## 2020-10-22 DIAGNOSIS — M722 Plantar fascial fibromatosis: Secondary | ICD-10-CM | POA: Diagnosis not present

## 2020-10-22 DIAGNOSIS — M25871 Other specified joint disorders, right ankle and foot: Secondary | ICD-10-CM

## 2020-10-22 DIAGNOSIS — S93521A Sprain of metatarsophalangeal joint of right great toe, initial encounter: Secondary | ICD-10-CM | POA: Diagnosis not present

## 2020-10-22 DIAGNOSIS — N926 Irregular menstruation, unspecified: Secondary | ICD-10-CM | POA: Insufficient documentation

## 2020-10-22 MED ORDER — METHYLPREDNISOLONE 4 MG PO TBPK: ORAL_TABLET | ORAL | 0 refills | Status: DC

## 2020-10-27 NOTE — Progress Notes (Signed)
  Subjective:  Patient ID: Monica Parrish, female    DOB: [birth year],  MRN: [id number]  Chief Complaint  Patient presents with   Foot Pain    (np) right foot arch pain-near the inside, joint pain    40 y.o. female presents with the above complaint. History confirmed with patient.  No history of injury it started a few days ago.  Is been keeping her awake at night  Objective:  Physical Exam: warm, good capillary refill, no trophic changes or ulcerative lesions, normal DP and PT pulses, and normal sensory exam. Left Foot: normal exam, no swelling, tenderness, instability; ligaments intact, full range of motion of all ankle/foot joints Right Foot: She has pain with range of motion of the hallux especially in dorsiflexion and pain on palpation along the fibular and tibial sesamoid   Radiographs: Multiple views x-ray of the right foot: Bipartite versus fractured sesamoid of the tibial sesamoid  Assessment:   1. Sesamoiditis of right foot   2. Turf toe of right foot      Plan:  Patient was evaluated and treated and all questions answered.  Reviewed the radiographs and clinical findings with the patient in detail.  I discussed with her she has sesamoiditis versus possible tibial sesamoid fracture.  Recommend immobilization in a cam boot which was dispensed and a methylprednisolone taper for anti-inflammatory effect.  Follow-up in 1 month for this with new x-rays.  Return in about 1 month (around 11/21/2020).

## 2020-11-09 DIAGNOSIS — K219 Gastro-esophageal reflux disease without esophagitis: Secondary | ICD-10-CM | POA: Diagnosis not present

## 2020-11-09 DIAGNOSIS — Z800 Family history of malignant neoplasm of digestive organs: Secondary | ICD-10-CM | POA: Diagnosis not present

## 2020-11-09 DIAGNOSIS — Z1211 Encounter for screening for malignant neoplasm of colon: Secondary | ICD-10-CM | POA: Diagnosis not present

## 2020-11-09 DIAGNOSIS — K317 Polyp of stomach and duodenum: Secondary | ICD-10-CM | POA: Diagnosis not present

## 2020-11-13 DIAGNOSIS — Z111 Encounter for screening for respiratory tuberculosis: Secondary | ICD-10-CM | POA: Diagnosis not present

## 2020-11-26 ENCOUNTER — Ambulatory Visit: Payer: BC Managed Care – PPO | Admitting: Podiatry

## 2020-12-03 ENCOUNTER — Ambulatory Visit (INDEPENDENT_AMBULATORY_CARE_PROVIDER_SITE_OTHER): Payer: BC Managed Care – PPO | Admitting: Podiatry

## 2020-12-03 ENCOUNTER — Other Ambulatory Visit: Payer: Self-pay

## 2020-12-03 DIAGNOSIS — M25872 Other specified joint disorders, left ankle and foot: Secondary | ICD-10-CM | POA: Diagnosis not present

## 2020-12-03 DIAGNOSIS — M25871 Other specified joint disorders, right ankle and foot: Secondary | ICD-10-CM

## 2020-12-03 DIAGNOSIS — S93521A Sprain of metatarsophalangeal joint of right great toe, initial encounter: Secondary | ICD-10-CM | POA: Diagnosis not present

## 2020-12-03 NOTE — Progress Notes (Signed)
Patient presents today to be casted for custom molded orthotics. Dr. Lilian Kapur has been treating patient for sesamoiditis.   Impression foam cast was taken. ABN signed.  Patient info-  Shoe size: 9.5  Shoe style: Sneakers  Height: 5'6  Weight: 200  ~Sub 1st MPJ right marked for offloading~   Patient will be notified once orthotics arrive in office and reappoint for fitting at that time.

## 2020-12-07 ENCOUNTER — Encounter: Payer: Self-pay | Admitting: Podiatry

## 2020-12-07 NOTE — Progress Notes (Signed)
  Subjective:  Patient ID: Monica Parrish, female    DOB: 1980-03-23,  MRN: [id number]  Chief Complaint  Patient presents with   Foot Pain    1 month follow up, right foot    40 y.o. female presents with the above complaint. History confirmed with patient.  She wore the CAM boot for about 4 weeks she is doing much better now  Objective:  Physical Exam: warm, good capillary refill, no trophic changes or ulcerative lesions, normal DP and PT pulses, and normal sensory exam. Left Foot: normal exam, no swelling, tenderness, instability; ligaments intact, full range of motion of all ankle/foot joints Right Foot: Good range of motion of the MTPJ with dorsiflexion there is mild pain on palpation to the flexor hallucis brevis tendon, none on the sesamoid itself now   Radiographs: Multiple views x-ray of the right foot: Bipartite versus fractured sesamoid of the tibial sesamoid  Assessment:   1. Sesamoiditis of right foot      Plan:  Patient was evaluated and treated and all questions answered.  Overall doing much better.  I recommend she gradually increase her activity and wear supportive shoe gear.  Recommended supporting with a custom molded foot orthosis with offloading of the first MTPJ as well.  She was casted for this today and we will let her know when is ready.  She was seen back after wearing it for any adjustments  Return if symptoms worsen or fail to improve, for after orthotics .

## 2020-12-31 ENCOUNTER — Telehealth: Payer: Self-pay | Admitting: Podiatry

## 2020-12-31 NOTE — Telephone Encounter (Signed)
Orthotics in.. lvm for pt to call to schedule an appt to pick them up. °

## 2021-03-22 ENCOUNTER — Emergency Department (HOSPITAL_BASED_OUTPATIENT_CLINIC_OR_DEPARTMENT_OTHER): Payer: BC Managed Care – PPO

## 2021-03-22 ENCOUNTER — Emergency Department (HOSPITAL_BASED_OUTPATIENT_CLINIC_OR_DEPARTMENT_OTHER)
Admission: EM | Admit: 2021-03-22 | Discharge: 2021-03-22 | Disposition: A | Discharge status: Home / Self Care | Payer: BC Managed Care – PPO | Attending: Emergency Medicine | Admitting: Emergency Medicine

## 2021-03-22 ENCOUNTER — Other Ambulatory Visit: Payer: Self-pay

## 2021-03-22 ENCOUNTER — Encounter (HOSPITAL_BASED_OUTPATIENT_CLINIC_OR_DEPARTMENT_OTHER): Payer: Self-pay | Admitting: Emergency Medicine

## 2021-03-22 DIAGNOSIS — R079 Chest pain, unspecified: Secondary | ICD-10-CM | POA: Diagnosis not present

## 2021-03-22 DIAGNOSIS — R197 Diarrhea, unspecified: Secondary | ICD-10-CM | POA: Insufficient documentation

## 2021-03-22 DIAGNOSIS — R1110 Vomiting, unspecified: Secondary | ICD-10-CM | POA: Insufficient documentation

## 2021-03-22 DIAGNOSIS — R9431 Abnormal electrocardiogram [ECG] [EKG]: Secondary | ICD-10-CM | POA: Insufficient documentation

## 2021-03-22 DIAGNOSIS — Z79899 Other long term (current) drug therapy: Secondary | ICD-10-CM | POA: Diagnosis not present

## 2021-03-22 LAB — COMPREHENSIVE METABOLIC PANEL
ALT: 15 U/L (ref 0–44)
AST: 19 U/L (ref 15–41)
Albumin: 3.6 g/dL (ref 3.5–5.0)
Alkaline Phosphatase: 105 U/L (ref 38–126)
Anion gap: 8 (ref 5–15)
BUN: 22 mg/dL — ABNORMAL HIGH (ref 6–20)
CO2: 27 mmol/L (ref 22–32)
Calcium: 8.1 mg/dL — ABNORMAL LOW (ref 8.9–10.3)
Chloride: 103 mmol/L (ref 98–111)
Creatinine, Ser: 1.03 mg/dL — ABNORMAL HIGH (ref 0.44–1.00)
GFR, Estimated: >60 mL/min (ref >60)
Glucose, Bld: 112 mg/dL — ABNORMAL HIGH (ref 70–99)
Potassium: 3.6 mmol/L (ref 3.5–5.1)
Sodium: 138 mmol/L (ref 135–145)
Total Bilirubin: 0.4 mg/dL (ref 0.3–1.2)
Total Protein: 6.9 g/dL (ref 6.5–8.1)

## 2021-03-22 LAB — URINALYSIS, ROUTINE W REFLEX MICROSCOPIC
Bilirubin Urine: NEGATIVE
Glucose, UA: NEGATIVE mg/dL
Ketones, ur: 15 mg/dL — AB
Nitrite: NEGATIVE
Protein, ur: 100 mg/dL — AB
RBC / HPF: >50 RBC/hpf — ABNORMAL HIGH (ref 0–5)
Specific Gravity, Urine: 1.041 — ABNORMAL HIGH (ref 1.005–1.030)
pH: 6 (ref 5.0–8.0)

## 2021-03-22 LAB — LIPASE, BLOOD: Lipase: 28 U/L (ref 11–51)

## 2021-03-22 LAB — CBC
HCT: 46.9 % — ABNORMAL HIGH (ref 36.0–46.0)
Hemoglobin: 15.5 g/dL — ABNORMAL HIGH (ref 12.0–15.0)
MCH: 27.7 pg (ref 26.0–34.0)
MCHC: 33 g/dL (ref 30.0–36.0)
MCV: 83.8 fL (ref 80.0–100.0)
Platelets: 330 10*3/uL (ref 150–400)
RBC: 5.6 MIL/uL — ABNORMAL HIGH (ref 3.87–5.11)
RDW: 14.8 % (ref 11.5–15.5)
WBC: 8.5 10*3/uL (ref 4.0–10.5)
nRBC: 0 % (ref 0.0–0.2)

## 2021-03-22 LAB — HCG, SERUM, QUALITATIVE: Preg, Serum: NEGATIVE

## 2021-03-22 LAB — TROPONIN I (HIGH SENSITIVITY)
Troponin I (High Sensitivity): 4 ng/L (ref <18)
Troponin I (High Sensitivity): 3 ng/L (ref <18)

## 2021-03-22 MED ORDER — LOPERAMIDE HCL 2 MG PO CAPS
4.0000 mg | ORAL_CAPSULE | Freq: Once | ORAL | Status: AC
  Administered 2021-03-22: 4 mg via ORAL
  Filled 2021-03-22: qty 2

## 2021-03-22 MED ORDER — SODIUM CHLORIDE 0.9 % IV BOLUS
1000.0000 mL | Freq: Once | INTRAVENOUS | Status: AC
  Administered 2021-03-22: 1000 mL via INTRAVENOUS

## 2021-03-22 MED ORDER — ACETAMINOPHEN 325 MG PO TABS
650.0000 mg | ORAL_TABLET | Freq: Once | ORAL | Status: AC
Start: 2021-03-22 — End: 2021-03-22
  Administered 2021-03-22: 650 mg via ORAL
  Filled 2021-03-22: qty 2

## 2021-03-22 MED ORDER — SULFAMETHOXAZOLE-TRIMETHOPRIM 800-160 MG PO TABS: 1.0000 | ORAL_TABLET | Freq: Two times a day (BID) | ORAL | 0 refills | Status: AC

## 2021-03-22 MED ORDER — ONDANSETRON HCL 4 MG PO TABS: 4.0000 mg | ORAL_TABLET | Freq: Three times a day (TID) | ORAL | 0 refills | Status: DC | PRN

## 2021-03-22 NOTE — ED Notes (Signed)
Patient aware urine sample needed, showed patient call bell and encouraged to push when she thinks she can provide urine sample

## 2021-03-22 NOTE — ED Triage Notes (Signed)
Pt arrives to ED with c/o weakness. Pt reports that over the weekend she had a stomach bug causing her to have vomiting and diarrhea starting x2 days ago. Today she denies vomiting/diarrhea but is having cramps and feels dehydrated. She reports stomach cramping.

## 2021-03-22 NOTE — Discharge Instructions (Signed)
Follow-up with cardiology within 1 week.  Return to the ER if you cannot keep down any fluids or have worsening symptoms.  Avoid strenuous activities until cleared by your cardiologist.

## 2021-03-22 NOTE — ED Provider Notes (Signed)
[facility]   CSN: [id number] Arrival date & time: 03/22/21  [id number]     History  Chief Complaint  Patient presents with   Weakness    Monica Parrish is a 41 y.o. female.  Patient presents to ER chief complaint of multiple episodes of vomiting diarrhea ongoing for 2 days.  She states that her son was sick with similar symptoms several days ago but has since recovered.  She describes cramping abdominal pain as well.  Denies fevers or cough denies shortness of breath denies chest pain.      Home Medications Prior to Admission medications   Medication Sig Start Date End Date Taking? Authorizing Provider  buPROPion (WELLBUTRIN XL) 300 MG 24 hr tablet bupropion HCl XL 300 mg 24 hr tablet, extended release  TAKE 1 TABLET BY MOUTH EVERY DAY IN THE MORNING    [provider]  methylPREDNISolone (MEDROL DOSEPAK) 4 MG TBPK tablet 6 day dose pack - take as directed 10/22/20   Edwin CapMcDonald, Adam R, DPM  norgestimate-ethinyl estradiol (ORTHO-CYCLEN) 0.25-35 MG-MCG tablet Take 1 tablet by mouth daily. 10/13/20   [provider]  omeprazole (PRILOSEC) 40 MG capsule omeprazole 40 mg capsule,delayed release    [provider]  oxyCODONE (OXY IR/ROXICODONE) 5 MG immediate release tablet Take 1 tablet (5 mg total) by mouth every 4 (four) hours as needed (pain scale 4-7). 04/11/17   Philip Aspenallahan, Sidney, DO  Prenatal Vit-Fe Fumarate-FA (MULTIVITAMIN-PRENATAL) 27-0.8 MG TABS tablet Take 1 tablet by mouth daily at 12 noon.    [provider]  ranitidine (ZANTAC) 150 MG capsule Take 150 mg by mouth 2 (two) times daily.    [provider]  sertraline (ZOLOFT) 50 MG tablet Take 50 mg by mouth daily. 10/15/20   [provider]      Allergies    Penicillins    Review of Systems   Review of Systems  Constitutional:  Negative for fever.  HENT:  Negative for ear pain.   Eyes:  Negative for pain.  Respiratory:  Negative for  cough.   Cardiovascular:  Negative for chest pain.  Gastrointestinal:  Positive for diarrhea and vomiting.  Genitourinary:  Negative for flank pain.  Musculoskeletal:  Negative for back pain.  Skin:  Negative for rash.  Neurological:  Negative for headaches.   Physical Exam Updated Vital Signs BP 109/90    Pulse 86    Temp 97.8 F (36.6 C)    Resp 17    Ht 5\' 6"  (1.676 m)    Wt 86.2 kg    SpO2 97%    BMI 30.67 kg/m  Physical Exam Constitutional:      General: She is not in acute distress.    Appearance: Normal appearance.  HENT:     Head: Normocephalic.     Nose: Nose normal.  Eyes:     Extraocular Movements: Extraocular movements intact.  Cardiovascular:     Rate and Rhythm: Normal rate.  Pulmonary:     Effort: Pulmonary effort is normal.  Abdominal:     Tenderness: There is no abdominal tenderness. There is no guarding or rebound.  Musculoskeletal:        General: Normal range of motion.     Cervical back: Normal range of motion.  Neurological:     General: No focal deficit present.     Mental Status: She is alert. Mental status is at baseline.    ED Results / Procedures / Treatments   Labs (  all labs ordered are listed, but only abnormal results are displayed) Labs Reviewed  COMPREHENSIVE METABOLIC PANEL - Abnormal; Notable for the following components:      Result Value   Glucose, Bld 112 (*)    BUN 22 (*)    Creatinine, Ser 1.03 (*)    Calcium 8.1 (*)    All other components within normal limits  CBC - Abnormal; Notable for the following components:   RBC 5.60 (*)    Hemoglobin 15.5 (*)    HCT 46.9 (*)    All other components within normal limits  URINALYSIS, ROUTINE W REFLEX MICROSCOPIC - Abnormal; Notable for the following components:   Color, Urine ORANGE (*)    APPearance CLOUDY (*)    Specific Gravity, Urine 1.041 (*)    Hgb urine dipstick LARGE (*)    Ketones, ur 15 (*)    Protein, ur 100 (*)    Leukocytes,Ua SMALL (*)    RBC / HPF >50 (*)     Bacteria, UA MANY (*)    All other components within normal limits  LIPASE, BLOOD  HCG, SERUM, QUALITATIVE  TROPONIN I (HIGH SENSITIVITY)  TROPONIN I (HIGH SENSITIVITY)    EKG EKG Interpretation  Date/Time:  Monday March 22 2021 14:24:15 EST Ventricular Rate:  95 PR Interval:  131 QRS Duration: 104 QT Interval:  360 QTC Calculation: 453 R Axis:   85 Text Interpretation: Sinus rhythm Low voltage, precordial leads Borderline T abnormalities, anterior leads Confirmed by Norman Clay ([id number]) on 03/22/2021 2:31:11 PM  Radiology DG Chest Port 1 View  Result Date: 03/22/2021 CLINICAL DATA:  Chest pain EXAM: PORTABLE CHEST 1 VIEW COMPARISON:  CT done on 04/25/2018 FINDINGS: The heart size and mediastinal contours are within normal limits. Both lungs are clear. The visualized skeletal structures are unremarkable. IMPRESSION: No active disease. Electronically Signed   By: Ernie Avena M.D.   On: 03/22/2021 12:01    Procedures Procedures    Medications Ordered in ED Medications  sodium chloride 0.9 % bolus 1,000 mL ( Intravenous Stopped 03/22/21 1143)  acetaminophen (TYLENOL) tablet 650 mg (650 mg Oral Given 03/22/21 1038)  loperamide (IMODIUM) capsule 4 mg (4 mg Oral Given 03/22/21 1038)    ED Course/ Medical Decision Making/ A&P                           Medical Decision Making Amount and/or Complexity of Data Reviewed Independent Historian: spouse External Data Reviewed: notes.    Details: Chart review shows patient visited podiatry office on 12/03/2020 for foot issue. Labs: ordered. Radiology: ordered.  Risk OTC drugs. Prescription drug management. Risk Details: Labs are unremarkable.  Patient arrived tachycardic but heart rate appears improved with IV fluid resuscitation.  Patient is now tolerating oral intake.  During her ER stay she started develop chest pain described as sharp pain in the left chest lasting about 30 seconds and then resolving but recurrent every  10 to 15 minutes.  Etiology of this is unclear.  EKG was done which shows sinus rhythm no signs mentions normal rate, however subtle ST depression seen in inferior leads.  Repeat EKG shows no change in these findings.  Troponins were sent negative x2.  Currently patient has no chest pain or chest discomfort.  Presentation appears atypical for acute coronary syndrome.  Given EKG abnormality will advise close outpatient follow-up with cardiology this week.  Advised immediate return for persistent chest pain or any additional concerns.  Advise avoidance of strenuous activity otherwise until cleared by her cardiologist.           Final Clinical Impression(s) / ED Diagnoses Final diagnoses:  Vomiting and diarrhea  Abnormal EKG  Chest pain, unspecified type    Rx / DC Orders ED Discharge Orders          Ordered    Ambulatory referral to Cardiology        03/22/21 1516              Cheryll Cockayne, MD 03/22/21 1517

## 2021-03-23 ENCOUNTER — Emergency Department (HOSPITAL_BASED_OUTPATIENT_CLINIC_OR_DEPARTMENT_OTHER): Payer: BC Managed Care – PPO

## 2021-03-23 ENCOUNTER — Other Ambulatory Visit: Payer: Self-pay

## 2021-03-23 ENCOUNTER — Encounter (HOSPITAL_BASED_OUTPATIENT_CLINIC_OR_DEPARTMENT_OTHER): Payer: Self-pay | Admitting: Radiology

## 2021-03-23 ENCOUNTER — Emergency Department (HOSPITAL_BASED_OUTPATIENT_CLINIC_OR_DEPARTMENT_OTHER)
Admission: EM | Admit: 2021-03-23 | Discharge: 2021-03-23 | Disposition: A | Discharge status: Home / Self Care | Payer: BC Managed Care – PPO | Attending: Emergency Medicine | Admitting: Emergency Medicine

## 2021-03-23 DIAGNOSIS — R197 Diarrhea, unspecified: Secondary | ICD-10-CM | POA: Diagnosis not present

## 2021-03-23 DIAGNOSIS — K529 Noninfective gastroenteritis and colitis, unspecified: Secondary | ICD-10-CM

## 2021-03-23 DIAGNOSIS — R1084 Generalized abdominal pain: Secondary | ICD-10-CM | POA: Diagnosis not present

## 2021-03-23 DIAGNOSIS — R112 Nausea with vomiting, unspecified: Secondary | ICD-10-CM | POA: Diagnosis not present

## 2021-03-23 DIAGNOSIS — R109 Unspecified abdominal pain: Secondary | ICD-10-CM | POA: Diagnosis not present

## 2021-03-23 LAB — COMPREHENSIVE METABOLIC PANEL
ALT: 14 U/L (ref 0–44)
AST: 18 U/L (ref 15–41)
Albumin: 3.3 g/dL — ABNORMAL LOW (ref 3.5–5.0)
Alkaline Phosphatase: 83 U/L (ref 38–126)
Anion gap: 10 (ref 5–15)
BUN: 18 mg/dL (ref 6–20)
CO2: 25 mmol/L (ref 22–32)
Calcium: 7.9 mg/dL — ABNORMAL LOW (ref 8.9–10.3)
Chloride: 102 mmol/L (ref 98–111)
Creatinine, Ser: 0.93 mg/dL (ref 0.44–1.00)
GFR, Estimated: >60 mL/min (ref >60)
Glucose, Bld: 119 mg/dL — ABNORMAL HIGH (ref 70–99)
Potassium: 3.5 mmol/L (ref 3.5–5.1)
Sodium: 137 mmol/L (ref 135–145)
Total Bilirubin: 0.3 mg/dL (ref 0.3–1.2)
Total Protein: 6.1 g/dL — ABNORMAL LOW (ref 6.5–8.1)

## 2021-03-23 LAB — CBC WITH DIFFERENTIAL/PLATELET
Abs Immature Granulocytes: 0.06 10*3/uL (ref 0.00–0.07)
Basophils Absolute: 0 10*3/uL (ref 0.0–0.1)
Basophils Relative: 0 %
Eosinophils Absolute: 0.1 10*3/uL (ref 0.0–0.5)
Eosinophils Relative: 1 %
HCT: 46.8 % — ABNORMAL HIGH (ref 36.0–46.0)
Hemoglobin: 15.3 g/dL — ABNORMAL HIGH (ref 12.0–15.0)
Immature Granulocytes: 1 %
Lymphocytes Relative: 21 %
Lymphs Abs: 2.4 10*3/uL (ref 0.7–4.0)
MCH: 26.7 pg (ref 26.0–34.0)
MCHC: 32.7 g/dL (ref 30.0–36.0)
MCV: 81.8 fL (ref 80.0–100.0)
Monocytes Absolute: 0.9 10*3/uL (ref 0.1–1.0)
Monocytes Relative: 8 %
Neutro Abs: 8.2 10*3/uL — ABNORMAL HIGH (ref 1.7–7.7)
Neutrophils Relative %: 69 %
Platelets: 347 10*3/uL (ref 150–400)
RBC: 5.72 MIL/uL — ABNORMAL HIGH (ref 3.87–5.11)
RDW: 14.5 % (ref 11.5–15.5)
WBC: 11.7 10*3/uL — ABNORMAL HIGH (ref 4.0–10.5)
nRBC: 0 % (ref 0.0–0.2)

## 2021-03-23 LAB — URINALYSIS, ROUTINE W REFLEX MICROSCOPIC
Bilirubin Urine: NEGATIVE
Glucose, UA: NEGATIVE mg/dL
Ketones, ur: >80 mg/dL — AB
Leukocytes,Ua: NEGATIVE
Nitrite: NEGATIVE
Protein, ur: 30 mg/dL — AB
Specific Gravity, Urine: >1.046 — ABNORMAL HIGH (ref 1.005–1.030)
pH: 6.5 (ref 5.0–8.0)

## 2021-03-23 LAB — LIPASE, BLOOD: Lipase: 88 U/L — ABNORMAL HIGH (ref 11–51)

## 2021-03-23 MED ORDER — SODIUM CHLORIDE 0.9 % IV BOLUS
1000.0000 mL | Freq: Once | INTRAVENOUS | Status: AC
  Administered 2021-03-23: 1000 mL via INTRAVENOUS

## 2021-03-23 MED ORDER — OXYCODONE-ACETAMINOPHEN 5-325 MG PO TABS: 1.0000 | ORAL_TABLET | Freq: Four times a day (QID) | ORAL | 0 refills | Status: DC | PRN

## 2021-03-23 MED ORDER — ONDANSETRON 4 MG PO TBDP
4.0000 mg | ORAL_TABLET | Freq: Once | ORAL | Status: AC
  Administered 2021-03-23: 4 mg via ORAL
  Filled 2021-03-23: qty 1

## 2021-03-23 MED ORDER — IOHEXOL 300 MG/ML  SOLN
80.0000 mL | Freq: Once | INTRAMUSCULAR | Status: AC | PRN
  Administered 2021-03-23: 80 mL via INTRAVENOUS

## 2021-03-23 MED ORDER — HYDROMORPHONE HCL 1 MG/ML IJ SOLN
0.5000 mg | Freq: Once | INTRAMUSCULAR | Status: AC
  Administered 2021-03-23: 0.5 mg via INTRAVENOUS
  Filled 2021-03-23: qty 1

## 2021-03-23 MED ORDER — OXYCODONE-ACETAMINOPHEN 5-325 MG PO TABS
1.0000 | ORAL_TABLET | Freq: Once | ORAL | Status: AC
  Administered 2021-03-23: 1 via ORAL
  Filled 2021-03-23: qty 1

## 2021-03-23 MED ORDER — ONDANSETRON HCL 4 MG/2ML IJ SOLN: 4.0000 mg | Freq: Once | INTRAMUSCULAR | Status: DC

## 2021-03-23 NOTE — ED Provider Notes (Signed)
[place] EMERGENCY DEPT Provider Note   CSN: [id number] Arrival date & time: 03/23/21  1343     History  Chief Complaint  Patient presents with   Abdominal Pain    Monica Parrish is a 41 y.o. female.  Patient with history of C-section --presents to the emergency department for ongoing abdominal pain and cramping, vomiting, diarrhea.  Patient was seen in the emergency department yesterday.  She had reassuring labs and symptoms were controlled.  She was diagnosed with UTI and discharged with Septra and Zofran.  Symptoms were worse this morning.  She has had multiple episodes of vomiting.  She is unable to hydrate herself.  Patient child recently had a stomach virus.  She appears very uncomfortable.  No reported fevers chest pain, shortness of breath.  No urinary symptoms at current time.      Home Medications Prior to Admission medications   Medication Sig Start Date End Date Taking? Authorizing Provider  buPROPion (WELLBUTRIN XL) 300 MG 24 hr tablet bupropion HCl XL 300 mg 24 hr tablet, extended release  TAKE 1 TABLET BY MOUTH EVERY DAY IN THE MORNING    [provider]  methylPREDNISolone (MEDROL DOSEPAK) 4 MG TBPK tablet 6 day dose pack - take as directed 10/22/20   Criselda Peaches, DPM  norgestimate-ethinyl estradiol (ORTHO-CYCLEN) 0.25-35 MG-MCG tablet Take 1 tablet by mouth daily. 10/13/20   [provider]  omeprazole (PRILOSEC) 40 MG capsule omeprazole 40 mg capsule,delayed release    [provider]  ondansetron (ZOFRAN) 4 MG tablet Take 1 tablet (4 mg total) by mouth every 8 (eight) hours as needed for nausea or vomiting. 03/22/21   Luna Fuse, MD  oxyCODONE (OXY IR/ROXICODONE) 5 MG immediate release tablet Take 1 tablet (5 mg total) by mouth every 4 (four) hours as needed (pain scale 4-7). 04/11/17   Allyn Kenner, DO  Prenatal Vit-Fe Fumarate-FA (MULTIVITAMIN-PRENATAL) 27-0.8 MG TABS tablet Take 1 tablet by mouth daily at 12 noon.     [provider]  ranitidine (ZANTAC) 150 MG capsule Take 150 mg by mouth 2 (two) times daily.    [provider]  sertraline (ZOLOFT) 50 MG tablet Take 50 mg by mouth daily. 10/15/20   [provider]  sulfamethoxazole-trimethoprim (BACTRIM DS) 800-160 MG tablet Take 1 tablet by mouth 2 (two) times daily for 7 days. 03/22/21 03/29/21  Luna Fuse, MD      Allergies    Penicillins    Review of Systems   Review of Systems  Physical Exam Updated Vital Signs BP 130/78    Pulse 90    Temp 98.5 F (36.9 C)    Resp 18    Ht 5\' 6"  (1.676 m)    Wt 86.1 kg    SpO2 99%    BMI 30.64 kg/m  Physical Exam Vitals and nursing note reviewed.  Constitutional:      General: She is in acute distress (Uncomfortable appearing).     Appearance: She is well-developed. She is ill-appearing.  HENT:     Head: Normocephalic and atraumatic.     Right Ear: External ear normal.     Left Ear: External ear normal.     Nose: Nose normal.  Eyes:     Conjunctiva/sclera: Conjunctivae normal.  Cardiovascular:     Rate and Rhythm: Normal rate and regular rhythm.     Heart sounds: No murmur heard. Pulmonary:     Effort: No respiratory distress.     Breath sounds:  No wheezing, rhonchi or rales.  Abdominal:     Palpations: Abdomen is soft.     Tenderness: There is generalized abdominal tenderness. There is no guarding or rebound.  Musculoskeletal:     Cervical back: Normal range of motion and neck supple.     Right lower leg: No edema.     Left lower leg: No edema.  Skin:    General: Skin is warm and dry.     Findings: No rash.  Neurological:     General: No focal deficit present.     Mental Status: She is alert. Mental status is at baseline.     Motor: No weakness.  Psychiatric:        Mood and Affect: Mood normal.    ED Results / Procedures / Treatments   Labs (all labs ordered are listed, but only abnormal results are displayed) Labs Reviewed  CBC WITH  DIFFERENTIAL/PLATELET - Abnormal; Notable for the following components:      Result Value   WBC 11.7 (*)    RBC 5.72 (*)    Hemoglobin 15.3 (*)    HCT 46.8 (*)    Neutro Abs 8.2 (*)    All other components within normal limits  COMPREHENSIVE METABOLIC PANEL - Abnormal; Notable for the following components:   Glucose, Bld 119 (*)    Calcium 7.9 (*)    Total Protein 6.1 (*)    Albumin 3.3 (*)    All other components within normal limits  LIPASE, BLOOD - Abnormal; Notable for the following components:   Lipase 88 (*)    All other components within normal limits  URINALYSIS, ROUTINE W REFLEX MICROSCOPIC    EKG None  Radiology CT Abdomen Pelvis W Contrast  Result Date: 03/23/2021 CLINICAL DATA:  Abdominal pain EXAM: CT ABDOMEN AND PELVIS WITH CONTRAST TECHNIQUE: Multidetector CT imaging of the abdomen and pelvis was performed using the standard protocol following bolus administration of intravenous contrast. RADIATION DOSE REDUCTION: This exam was performed according to the departmental dose-optimization program which includes automated exposure control, adjustment of the mA and/or kV according to patient size and/or use of iterative reconstruction technique. CONTRAST:  47mL OMNIPAQUE IOHEXOL 300 MG/ML  SOLN COMPARISON:  CT abdomen and pelvis dated March 12, 2012. FINDINGS: Lower chest: No acute abnormality. Hepatobiliary: No focal liver abnormality is seen. No gallstones, gallbladder wall thickening, or biliary dilatation. Pancreas: Unremarkable. No pancreatic ductal dilatation or surrounding inflammatory changes. Spleen: Normal in size without focal abnormality. Adrenals/Urinary Tract: Bilateral adrenal glands are unremarkable. No hydronephrosis or nephrolithiasis. Low-attenuation lesions of the lower pole the right kidney which are too small to completely characterize but likely simple cysts. Bladder is unremarkable. Stomach/Bowel: Multiple thick-walled loops of distal small bowel with  mucosal hyperenhancement and associated fat stranding of the small bowel mesentery. Normal appendix. Large bowel is unremarkable. No evidence of obstruction. Vascular/Lymphatic: Multiple prominent subcentimeter mesenteric lymph nodes. Reference mesenteric lymph node measuring 7 mm in short axis on series 2, image 45. no significant vascular findings. Reproductive: Uterus and bilateral adnexa are unremarkable. Other: Trace free fluid in the pelvis, likely reactive. Musculoskeletal: No acute or significant osseous findings. IMPRESSION: 1. Multiple thick-walled loops of distal small bowel with mucosal hyperenhancement and associated fat stranding of the small bowel mesentery. Findings are likely due to an infectious or inflammatory enteritis. 2. Multiple prominent subcentimeter mesenteric lymph nodes, likely reactive. Electronically Signed   By: Yetta Glassman M.D.   On: 03/23/2021 16:31   DG Chest Port 1  View  Result Date: 03/22/2021 CLINICAL DATA:  Chest pain EXAM: PORTABLE CHEST 1 VIEW COMPARISON:  CT done on 04/25/2018 FINDINGS: The heart size and mediastinal contours are within normal limits. Both lungs are clear. The visualized skeletal structures are unremarkable. IMPRESSION: No active disease. Electronically Signed   By: Elmer Picker M.D.   On: 03/22/2021 12:01    Procedures Procedures    Medications Ordered in ED Medications  HYDROmorphone (DILAUDID) injection 0.5 mg (0.5 mg Intravenous Given 03/23/21 1641)  sodium chloride 0.9 % bolus 1,000 mL (1,000 mLs Intravenous New Bag/Given 03/23/21 1641)  ondansetron (ZOFRAN-ODT) disintegrating tablet 4 mg (4 mg Oral Given 03/23/21 1415)  iohexol (OMNIPAQUE) 300 MG/ML solution 80 mL (80 mLs Intravenous Contrast Given 03/23/21 1610)    ED Course/ Medical Decision Making/ A&P    Patient seen and examined. History obtained directly from patient and husband at bedside who recounts symptoms this morning.  I initially saw the patient in triage and  ordered repeat labs and imaging.  Now the patient is back in room, she has received pain medication and IV fluids and is feeling better.  Labs/EKG: Personally and reviewed and interpreted CBC showing mildly elevated white count slightly higher than yesterday, high hemoglobin at 15.3; CMP with slightly low calcium and glucose of 119; lipase elevated from yesterday at 88.  Imaging: Personally reviewed and interpreted CT abdomen pelvis which demonstrates enteritis and reactive lymphadenopathy.  Medications/Fluids: Ordered: IV Dilaudid, ODT Zofran, IV fluids.   Most recent vital signs reviewed and are as follows: BP 130/78    Pulse 90    Temp 98.5 F (36.9 C)    Resp 18    Ht 5\' 6"  (1.676 m)    Wt 86.1 kg    SpO2 99%    BMI 30.64 kg/m   Initial impression: Enteritis, abdominal pain.    Reassessment performed. Patient appears more comfortable now.   Labs and imaging to this point personally reviewed and interpreted including: UA demonstrating signs of dehydration including elevated spec gravity and urine ketones.  Reviewed pertinent lab work and imaging with patient at bedside including: Urine dehydration.  Plan: Patient is feeling better, drinking water at bedside without vomiting.  She does not feel nauseous.  Will transition to oral pain medication and reassess.  If she continues to do well, plan for discharge to home.     9:10 PM Reassessment performed. Patient appears comfortable.   Most current vital signs reviewed and are as follows: BP 116/85    Pulse 76    Temp 98.5 F (36.9 C) (Oral)    Resp 18    Ht 5\' 6"  (1.676 m)    Wt 86.1 kg    SpO2 97%    BMI 30.64 kg/m   Plan: Discharge to home  Home treatment: Prescription written for oral Percocet, patient has Zofran from yesterday.  Discussed, that she should probably hold Septra especially since she does not currently have any urinary symptoms.  Cause of her nausea, vomiting, and diarrhea is almost certainly enteritis.  Patient  counseled on use of narcotic pain medications. Counseled not to combine these medications with others containing tylenol. Urged not to drink alcohol, drive, or perform any other activities that requires focus while taking these medications. The patient verbalizes understanding and agrees with the plan.  The patient was urged to return to the Emergency Department immediately with worsening of current symptoms, worsening abdominal pain, persistent vomiting, blood noted in stools, fever, or any other concerns. The  patient verbalized understanding.   Encourage PCP follow-up for recheck if not improved.                            Medical Decision Making Amount and/or Complexity of Data Reviewed Labs: ordered. Radiology: ordered.  Risk Prescription drug management.   For this patient's complaint of abdominal pain, the following conditions were considered on the differential diagnosis: gastritis/PUD, enteritis/duodenitis, appendicitis, cholelithiasis/cholecystitis, cholangitis, pancreatitis, ruptured viscus, colitis, diverticulitis, proctitis, cystitis, pyelonephritis, ureteral colic, aortic dissection, aortic aneurysm. In women, ectopic pregnancy, pelvic inflammatory disease, ovarian cysts, and tubo-ovarian abscess were also considered. Atypical chest etiologies were also considered including ACS, PE, and pneumonia.   The patient's vital signs, pertinent lab work and imaging were reviewed and interpreted as discussed in the ED course. Hospitalization was considered for further testing, treatments, or serial exams/observation. However as patient is well-appearing, has a stable exam, and reassuring studies today, I do not feel that they warrant admission at this time. This plan was discussed with the patient who verbalizes agreement and comfort with this plan and seems reliable and able to return to the Emergency Department with worsening or changing symptoms.          Final Clinical  Impression(s) / ED Diagnoses Final diagnoses:  Enteritis  Generalized abdominal pain  Nausea vomiting and diarrhea    Rx / DC Orders ED Discharge Orders          Ordered    oxyCODONE-acetaminophen (PERCOCET/ROXICET) 5-325 MG tablet  Every 6 hours PRN        03/23/21 2111              Carlisle Cater, PA-C 03/23/21 2204    Tegeler, Gwenyth Allegra, MD 03/24/21 [phone]

## 2021-03-23 NOTE — ED Provider Triage Note (Signed)
Emergency Medicine Provider Triage Evaluation Note  Monica Parrish , a 41 y.o. female  was evaluated in triage.  Pt complains of ongoing severe abdominal cramping and vomiting.  Was seen in the emergency department yesterday and had overall reassuring labs but with signs of dehydration.  She was given IV fluids.  States that diarrhea has improved.  Abdominal pain is generalized.  Review of Systems  Positive: Vomiting, abdominal pain Negative: Fever  Physical Exam  BP (!) 125/94    Pulse (!) 108    Temp 98.5 F (36.9 C)    Resp 16    Ht 5\' 6"  (1.676 m)    Wt 86.1 kg    SpO2 98%    BMI 30.64 kg/m  Gen:   Awake, appears uncomfortable Resp:  Normal effort  MSK:   Moves extremities without difficulty  Other:  Generalized abdominal tenderness without rebound or guarding, mildly pale on exam  Medical Decision Making  Medically screening exam initiated at 2:10 PM.  Appropriate orders placed.  Monica Parrish was informed that the remainder of the evaluation will be completed by another provider, this initial triage assessment does not replace that evaluation, and the importance of remaining in the ED until their evaluation is complete.  Will recheck labs and give CT, fluids, pain control.   Carlisle Cater, PA-C 03/23/21 1413

## 2021-03-23 NOTE — ED Notes (Signed)
Dc instructions and scripts reviewed with pt no questions or concerns at this time. Will follow up with pcp in 3 days.  

## 2021-03-23 NOTE — Discharge Instructions (Addendum)
Please read and follow all provided instructions.  Your diagnoses today include:  1. Enteritis   2. Generalized abdominal pain   3. Nausea vomiting and diarrhea     Tests performed today include: Blood cell counts and platelets: Slightly elevated white blood cell count Kidney and liver function tests: Normal Pancreas function test (called lipase): slightly elevated but likely due to enteritis Urine test to look for infection: Shows signs of dehydration CT scan of your abdomen pelvis: Demonstrates enteritis, no other concerning findings Vital signs. See below for your results today.   Medications prescribed:  Percocet (oxycodone/acetaminophen) - narcotic pain medication  DO NOT drive or perform any activities that require you to be awake and alert because this medicine can make you drowsy. BE VERY CAREFUL not to take multiple medicines containing Tylenol (also called acetaminophen). Doing so can lead to an overdose which can damage your liver and cause liver failure and possibly death.  Take any prescribed medications only as directed.  Home care instructions:  Follow any educational materials contained in this packet.  Follow-up instructions: Please follow-up with your primary care provider in the next 3 days for further evaluation of your symptoms if not improving.    Return instructions:  SEEK IMMEDIATE MEDICAL ATTENTION IF: The pain does not go away or becomes severe  A temperature above 101F develops  Repeated vomiting occurs (multiple episodes)  The pain becomes localized to portions of the abdomen. The right side could possibly be appendicitis. In an adult, the left lower portion of the abdomen could be colitis or diverticulitis.  Blood is being passed in stools or vomit (bright red or black tarry stools)  You develop chest pain, difficulty breathing, dizziness or fainting, or become confused, poorly responsive, or inconsolable (young children) If you have any other  emergent concerns regarding your health  Additional Information: Abdominal (belly) pain can be caused by many things. Your caregiver performed an examination and possibly ordered blood/urine tests and imaging (CT scan, x-rays, ultrasound). Many cases can be observed and treated at home after initial evaluation in the emergency department. Even though you are being discharged home, abdominal pain can be unpredictable. Therefore, you need a repeated exam if your pain does not resolve, returns, or worsens. Most patients with abdominal pain don't have to be admitted to the hospital or have surgery, but serious problems like appendicitis and gallbladder attacks can start out as nonspecific pain. Many abdominal conditions cannot be diagnosed in one visit, so follow-up evaluations are very important.  Your vital signs today were: BP 116/85    Pulse 76    Temp 98.5 F (36.9 C)    Resp 18    Ht 5\' 6"  (1.676 m)    Wt 86.1 kg    SpO2 97%    BMI 30.64 kg/m  If your blood pressure (bp) was elevated above 135/85 this visit, please have this repeated by your doctor within one month. --------------

## 2021-03-23 NOTE — ED Triage Notes (Addendum)
Patient arrives with complaints of abdominal pain/cramps, nausea, and vomiting (after eating). She was seen here yesterday for the same and she was diagnosed with dehydration and a UTI. Abdominal pain and cramps returned this morning and the patient reports she can't keep anything.  Patient is recovering from stomach virus, per her husband.   PA, Josh assessing patient in triage at 1407

## 2021-03-26 DIAGNOSIS — N3000 Acute cystitis without hematuria: Secondary | ICD-10-CM | POA: Diagnosis not present

## 2021-03-26 DIAGNOSIS — K529 Noninfective gastroenteritis and colitis, unspecified: Secondary | ICD-10-CM | POA: Diagnosis not present

## 2021-05-11 DIAGNOSIS — D2222 Melanocytic nevi of left ear and external auricular canal: Secondary | ICD-10-CM | POA: Diagnosis not present

## 2021-05-11 DIAGNOSIS — L409 Psoriasis, unspecified: Secondary | ICD-10-CM | POA: Diagnosis not present

## 2021-05-11 DIAGNOSIS — D485 Neoplasm of uncertain behavior of skin: Secondary | ICD-10-CM | POA: Diagnosis not present

## 2021-05-11 DIAGNOSIS — Z23 Encounter for immunization: Secondary | ICD-10-CM | POA: Diagnosis not present

## 2021-05-18 DIAGNOSIS — E6609 Other obesity due to excess calories: Secondary | ICD-10-CM | POA: Diagnosis not present

## 2021-05-18 DIAGNOSIS — Z6833 Body mass index (BMI) 33.0-33.9, adult: Secondary | ICD-10-CM | POA: Diagnosis not present

## 2021-07-26 ENCOUNTER — Ambulatory Visit: Payer: BC Managed Care – PPO

## 2021-07-26 DIAGNOSIS — M25871 Other specified joint disorders, right ankle and foot: Secondary | ICD-10-CM

## 2021-07-26 NOTE — Progress Notes (Signed)
SITUATION: Reason for Visit: Fitting and Delivery of Custom Fabricated Foot Orthoses Patient Report: Patient reports comfort and is satisfied with device.  OBJECTIVE DATA: Patient History / Diagnosis:     ICD-10-CM   1. Sesamoiditis of right foot  M25.871       Provided Device:  Custom Functional Foot Orthotics     [employer]: [id number]  GOAL OF ORTHOSIS - Improve gait - Decrease energy expenditure - Improve Balance - Provide Triplanar stability of foot complex - Facilitate motion  ACTIONS PERFORMED Patient was fit with foot orthotics trimmed to shoe last. Patient tolerated fittign procedure.   Patient was provided with verbal and written instruction and demonstration regarding donning, doffing, wear, care, proper fit, function, purpose, cleaning, and use of the orthosis and in all related precautions and risks and benefits regarding the orthosis.  Patient was also provided with verbal instruction regarding how to report any failures or malfunctions of the orthosis and necessary follow up care. Patient was also instructed to contact our office regarding any change in status that may affect the function of the orthosis.  Patient demonstrated independence with proper donning, doffing, and fit and verbalized understanding of all instructions.  PLAN: Patient is to follow up in one week or as necessary (PRN). All questions were answered and concerns addressed. Plan of care was discussed with and agreed upon by the patient.

## 2021-11-08 DIAGNOSIS — F411 Generalized anxiety disorder: Secondary | ICD-10-CM | POA: Diagnosis not present

## 2021-11-08 DIAGNOSIS — R635 Abnormal weight gain: Secondary | ICD-10-CM | POA: Diagnosis not present

## 2021-11-21 DIAGNOSIS — R062 Wheezing: Secondary | ICD-10-CM | POA: Diagnosis not present

## 2021-11-21 DIAGNOSIS — R051 Acute cough: Secondary | ICD-10-CM | POA: Diagnosis not present

## 2022-01-17 DIAGNOSIS — Z01419 Encounter for gynecological examination (general) (routine) without abnormal findings: Secondary | ICD-10-CM | POA: Diagnosis not present

## 2022-01-17 DIAGNOSIS — Z124 Encounter for screening for malignant neoplasm of cervix: Secondary | ICD-10-CM | POA: Diagnosis not present

## 2022-01-17 DIAGNOSIS — Z1231 Encounter for screening mammogram for malignant neoplasm of breast: Secondary | ICD-10-CM | POA: Diagnosis not present

## 2022-02-09 DIAGNOSIS — R41840 Attention and concentration deficit: Secondary | ICD-10-CM | POA: Diagnosis not present

## 2022-02-11 DIAGNOSIS — F902 Attention-deficit hyperactivity disorder, combined type: Secondary | ICD-10-CM | POA: Diagnosis not present

## 2022-02-16 DIAGNOSIS — R41840 Attention and concentration deficit: Secondary | ICD-10-CM | POA: Diagnosis not present

## 2022-02-16 DIAGNOSIS — F419 Anxiety disorder, unspecified: Secondary | ICD-10-CM | POA: Diagnosis not present

## 2022-03-03 DIAGNOSIS — Z713 Dietary counseling and surveillance: Secondary | ICD-10-CM | POA: Diagnosis not present

## 2022-03-09 DIAGNOSIS — Z713 Dietary counseling and surveillance: Secondary | ICD-10-CM | POA: Diagnosis not present

## 2022-03-23 DIAGNOSIS — Z713 Dietary counseling and surveillance: Secondary | ICD-10-CM | POA: Diagnosis not present

## 2022-03-29 DIAGNOSIS — R000 Tachycardia, unspecified: Secondary | ICD-10-CM | POA: Diagnosis not present

## 2022-03-29 DIAGNOSIS — R079 Chest pain, unspecified: Secondary | ICD-10-CM | POA: Diagnosis not present

## 2022-03-31 DIAGNOSIS — R000 Tachycardia, unspecified: Secondary | ICD-10-CM | POA: Diagnosis not present

## 2022-04-01 ENCOUNTER — Other Ambulatory Visit: Payer: Self-pay | Admitting: Family Medicine

## 2022-04-01 DIAGNOSIS — R7989 Other specified abnormal findings of blood chemistry: Secondary | ICD-10-CM

## 2022-04-06 DIAGNOSIS — Z713 Dietary counseling and surveillance: Secondary | ICD-10-CM | POA: Diagnosis not present

## 2022-04-20 DIAGNOSIS — Z713 Dietary counseling and surveillance: Secondary | ICD-10-CM | POA: Diagnosis not present

## 2022-04-21 DIAGNOSIS — R748 Abnormal levels of other serum enzymes: Secondary | ICD-10-CM | POA: Diagnosis not present

## 2022-04-21 DIAGNOSIS — K219 Gastro-esophageal reflux disease without esophagitis: Secondary | ICD-10-CM | POA: Diagnosis not present

## 2022-04-21 DIAGNOSIS — E669 Obesity, unspecified: Secondary | ICD-10-CM | POA: Diagnosis not present

## 2022-04-21 DIAGNOSIS — Z800 Family history of malignant neoplasm of digestive organs: Secondary | ICD-10-CM | POA: Diagnosis not present

## 2022-04-21 NOTE — Progress Notes (Signed)
Cardiology Office Note:    Date:  04/22/2022   ID:  Monica Parrish, DOB 09-11-1980, MRN [id number]  PCP:  Deland Pretty, MD   [place] Prov[facility] Cardiologist:  Lenna Sciara, MD Referring MD: Glenis Smoker, *   Chief Complaint/Reason for Referral: Sinus tachycardia and chest pain  ASSESSMENT:    1. Precordial pain   2. Sinus tachycardia   3. BMI 33.0-33.9,adult     PLAN:    In order of problems listed above: 1.  Chest pain:  We will obtain a coronary CTA and echocardiogram to evaluate further.  If the patient has mild obstructive coronary artery disease, they will require a statin (with goal LDL < 70) and aspirin, if they have high-grade disease we will need to consider optimal medical therapy and if symptoms are refractory to medical therapy, then a cardiac catheterization with possible PCI will be pursued to alleviate symptoms.  If they have high risk disease we will proceed directly to cardiac catheterization.   2.  Sinus tachycardia: Patient was started on a stimulant recently.  Her heart rate is normal today.  Will place monitor to evaluate for significant arrhythmias. 3.  Elevated BMI: The patient is interested in pursuing pharmacotherapy and will be talking to her PCP about this.             Dispo:  Return if symptoms worsen or fail to improve.      Medication Adjustments/Labs and Tests Ordered: Current medicines are reviewed at length with the patient today.  Concerns regarding medicines are outlined above.  The following changes have been made:  no change   Labs/tests ordered: Orders Placed This Encounter  Procedures   CT CORONARY MORPH W/CTA COR W/SCORE W/CA W/CM &/OR WO/CM   LONG TERM MONITOR (3-14 DAYS)   EKG 12-Lead   ECHOCARDIOGRAM COMPLETE    Medication Changes: No orders of the defined types were placed in this encounter.    Current medicines are reviewed at length with the patient today.  The patient does not have concerns regarding  medicines.   History of Present Illness:    FOCUSED PROBLEM LIST:   1.  Anxiety 2.  ADHD 3.  BMI 33  The patient is a 42 y.o. female with the indicated medical history here for recommendations regarding sinus tachycardia and chest pain.  The patient was seen by her primary care provider recently.  She was noted to be exhibiting a resting tachycardia.  Of note she was started on a Vyvanse for ADHD by one of her other medical providers.  The patient also reported constant left arm and chest pain.  Labs were drawn including a normal TSH, CMP notable for GFR of 79, hemoglobin 12.9, and negative D-dimer.  The Vyvanse was discontinued a few weeks ago.  Her pain and tachycardia have improved somewhat however she still occasionally gets pain at rest and with exertion.  She denies any presyncope or syncope, paroxysmal atrial dyspnea, orthopnea.  She does not smoke.  She works part-time from home.  She tells me that she has a 56-year-old at home and when she was pregnant with the child she gained a lot of weight.  She then lost some but then gained quite a bit more.  She has been trying to lose weight but has been relatively unsuccessful unfortunately.          Current Medications: Current Meds  Medication Sig   buPROPion (WELLBUTRIN XL) 300 MG 24 hr tablet bupropion HCl XL 300  mg 24 hr tablet, extended release  TAKE 1 TABLET BY MOUTH EVERY DAY IN THE MORNING   norgestimate-ethinyl estradiol (ORTHO-CYCLEN) 0.25-35 MG-MCG tablet Take 1 tablet by mouth daily.   omeprazole (PRILOSEC) 40 MG capsule omeprazole 40 mg capsule,delayed release   sertraline (ZOLOFT) 50 MG tablet Take 50 mg by mouth daily.     Allergies:    Penicillins   Social History:   Social History   Tobacco Use   Smoking status: Never   Smokeless tobacco: Never  Substance Use Topics   Alcohol use: No   Drug use: No     Family Hx: Family History  Problem Relation Age of Onset   Cancer Mother      Review of Systems:    Please see the history of present illness.    All other systems reviewed and are negative.     EKGs/Labs/Other Test Reviewed:    EKG:  EKG performed January 2023 that I personally reviewed demonstrates sinus rhythm with T wave inversion V1 and diffuse nonspecific ST and T wave changes; EKG performed today that I personally reviewed demonstrates sinus rhythm with sinus arrhythmia.  Prior CV studies:     Other studies Reviewed: Review of the additional studies/records demonstrates: Imaging studies reviewed demonstrate no aortic atherosclerosis or coronary artery calcification  Recent Labs: No results found for requested labs within last 365 days.   Recent Lipid Panel No results found for: "CHOL", "TRIG", "HDL", "LDLCALC", "LDLDIRECT"  Risk Assessment/Calculations:                Physical Exam:    VS:  BP 114/88   Pulse 76   Ht '5\' 6"'$  (1.676 m)   Wt 209 lb 12.8 oz (95.2 kg)   SpO2 98%   BMI 33.86 kg/m    Wt Readings from Last 3 Encounters:  04/22/22 209 lb 12.8 oz (95.2 kg)  03/23/21 189 lb 13.1 oz (86.1 kg)  03/22/21 190 lb (86.2 kg)    GENERAL:  No apparent distress, AOx3 HEENT:  No carotid bruits, +2 carotid impulses, no scleral icterus CAR: RRR no murmurs, gallops, rubs, or thrills RES:  Clear to auscultation bilaterally ABD:  Soft, nontender, nondistended, positive bowel sounds x 4 VASC:  +2 radial pulses, +2 carotid pulses, palpable pedal pulses NEURO:  CN 2-12 grossly intact; motor and sensory grossly intact PSYCH:  No active depression or anxiety EXT:  No edema, ecchymosis, or cyanosis  Signed, Early Osmond, MD  04/22/2022 10:43 AM    [facility] [employer], [place], [place]  [address] Phone: [phone]; Fax: [phone]   Note:  This document was prepared using Dragon voice recognition software and may include unintentional dictation errors.

## 2022-04-22 ENCOUNTER — Ambulatory Visit: Payer: BC Managed Care – PPO | Attending: Internal Medicine | Admitting: Internal Medicine

## 2022-04-22 ENCOUNTER — Encounter: Payer: Self-pay | Admitting: Internal Medicine

## 2022-04-22 ENCOUNTER — Ambulatory Visit (INDEPENDENT_AMBULATORY_CARE_PROVIDER_SITE_OTHER): Payer: BC Managed Care – PPO

## 2022-04-22 VITALS — BP 114/88 | HR 76 | Ht 66.0 in | Wt 209.8 lb

## 2022-04-22 DIAGNOSIS — Z6833 Body mass index (BMI) 33.0-33.9, adult: Secondary | ICD-10-CM

## 2022-04-22 DIAGNOSIS — R000 Tachycardia, unspecified: Secondary | ICD-10-CM

## 2022-04-22 DIAGNOSIS — R072 Precordial pain: Secondary | ICD-10-CM | POA: Diagnosis not present

## 2022-04-22 MED ORDER — METOPROLOL TARTRATE 100 MG PO TABS: 100.0000 mg | ORAL_TABLET | Freq: Once | ORAL | 0 refills | Status: AC

## 2022-04-22 NOTE — Patient Instructions (Addendum)
Medication Instructions:  Your physician recommends that you continue on your current medications as directed. Please refer to the Current Medication list given to you today.  *If you need a refill on your cardiac medications before your next appointment, please call your pharmacy*    Testing/Procedures: Your physician has requested that you have an echocardiogram. Echocardiography is a painless test that uses sound waves to create images of your heart. It provides your doctor with information about the size and shape of your heart and how well your heart's chambers and valves are working. This procedure takes approximately one hour. There are no restrictions for this procedure. Please do NOT wear cologne, perfume, aftershave, or lotions (deodorant is allowed). Please arrive 15 minutes prior to your appointment time.  Your physician has requested that you have cardiac CT. Cardiac computed tomography (CT) is a painless test that uses an x-ray machine to take clear, detailed pictures of your heart. For further information please visit [email or web address]. Please follow instruction sheet as given.    Your physician has recommended that you wear an event monitor. Event monitors are medical devices that record the heart's electrical activity. Doctors most often [place] these monitors to diagnose arrhythmias. Arrhythmias are problems with the speed or rhythm of the heartbeat. The monitor is a small, portable device. You can wear one while you do your normal daily activities. This is usually used to diagnose what is causing palpitations/syncope (passing out).   Follow-Up: At [facility], you and your health needs are our priority.  As part of our continuing mission to provide you with exceptional heart care, we have created designated Provider Care Teams.  These Care Teams include your primary Cardiologist (physician) and Advanced Practice Providers (APPs -  Physician Assistants and Nurse  Practitioners) who all work together to provide you with the care you need, when you need it.  We recommend signing up for the patient portal called "MyChart".  Sign up information is provided on this After Visit Summary.  MyChart is used to connect with patients for Virtual Visits (Telemedicine).  Patients are able to view lab/test results, encounter notes, upcoming appointments, etc.  Non-urgent messages can be sent to your provider as well.   To learn more about what you can do with MyChart, go to [email or web address].    Your next appointment:   As needed  Provider:   Dr. Ali Lowe  Other Instructions [place] Monitor Instructions  Your physician has requested you wear a ZIO patch monitor for 14 days.  This is a single patch monitor. [employer] supplies one patch monitor per enrollment. Additional stickers are not available. Please do not apply patch if you will be having a Nuclear Stress Test,  Echocardiogram, Cardiac CT, MRI, or Chest Xray during the period you would be wearing the  monitor. The patch cannot be worn during these tests. You cannot remove and re-apply the  ZIO XT patch monitor.  Your ZIO patch monitor will be mailed 3 day USPS to your address on file. It may take 3-5 days  to receive your monitor after you have been enrolled.  Once you have received your monitor, please review the enclosed instructions. Your monitor  has already been registered assigning a specific monitor serial # to you.  Billing and Patient Assistance Program Information  We have supplied [employer] with any of your insurance information on file for billing purposes. [employer] offers a sliding scale Patient Assistance Program for patients that do not have  insurance,  or whose insurance does not completely cover the cost of the ZIO monitor.  You must apply for the Patient Assistance Program to qualify for this discounted rate.  To apply, please call [employer] at [phone], select option 4,  select option 2, ask to apply for  Patient Assistance Program. Theodore Demark will ask your household income, and how many people  are in your household. They will quote your out-of-pocket cost based on that information.  [employer] will also be able to set up a 59-month interest-free payment plan if needed.  Applying the monitor   Shave hair from upper left chest.  Hold abrader disc by orange tab. Rub abrader in 40 strokes over the upper left chest as  indicated in your monitor instructions.  Clean area with 4 enclosed alcohol pads. Let dry.  Apply patch as indicated in monitor instructions. Patch will be placed under collarbone on left  side of chest with arrow pointing upward.  Rub patch adhesive wings for 2 minutes. Remove white label marked "1". Remove the white  label marked "2". Rub patch adhesive wings for 2 additional minutes.  While looking in a mirror, press and release button in center of patch. A small green light will  flash 3-4 times. This will be your only indicator that the monitor has been turned on.  Do not shower for the first 24 hours. You may shower after the first 24 hours.  Press the button if you feel a symptom. You will hear a small click. Record Date, Time and  Symptom in the Patient Logbook.  When you are ready to remove the patch, follow instructions on the last 2 pages of Patient  Logbook. Stick patch monitor onto the last page of Patient Logbook.  Place Patient Logbook in the blue and white box. Use locking tab on box and tape box closed  securely. The blue and white box has prepaid postage on it. Please place it in the mailbox as  soon as possible. Your physician should have your test results approximately 7 days after the  monitor has been [facility]  Call IF[place]landat [phone] you have questions regarding  your ZIO XT patch monitor. Call them immediately if you see an orange light blinking on your  monitor.  If your  monitor falls off in less than 4 days, contact our Monitor department at [phone]  If your monitor becomes loose or falls off after 4 days call [employer] at [phone]  suggestions on securing your monitor      Your cardiac CT will be scheduled at one of the below locations:   [facility] [phone] [facility] minutes prior to test start time. You can use the FREE valet parking offered at entrance C (encouraged to control the heart rate for the test)  [facility] (first floor) to check-in and test prep.  All radiology patients and guests should use entrance C2 at [facility] even though the hospital's physical address listed is [address]       Please follow these instructions carefully (unless otherwise directed):    On the Night Before the Test: Be sure to Drink plenty of water. Do not consume any caffeinated/decaffeinated beverages or chocolate 12 hours prior to your test. Do not take  any antihistamines 12 hours prior to your test.   On the Day of the Test: Drink plenty of water until 1 hour prior to the test. Do not eat any food 1 hour prior to test. You may take your regular medications prior to the test.  Take metoprolol (Lopressor) two hours prior to test. If you take Furosemide/Hydrochlorothiazide/Spironolactone, please HOLD on the morning of the test. FEMALES- please wear underwire-free bra if available, avoid dresses & tight clothing        After the Test: Drink plenty of water. After receiving IV contrast, you may experience a mild flushed feeling. This is normal. On occasion, you may experience a mild rash up to 24 hours after the test. This is not dangerous. If this occurs, you can take Benadryl 25 mg and  increase your fluid intake. If you experience trouble breathing, this can be serious. If it is severe call 911 IMMEDIATELY. If it is mild, please call our office. If you take any of these medications: Glipizide/Metformin, Avandament, Glucavance, please do not take 48 hours after completing test unless otherwise instructed.  We will call to schedule your test 2-4 weeks out understanding that some insurance companies will need an authorization prior to the service being performed.   For non-scheduling related questions, please contact the cardiac imaging nurse navigator should you have any questions/concerns: Marchia Bond, Cardiac Imaging Nurse Navigator Gordy Clement, Cardiac Imaging Nurse Navigator [facility] Direct Office Dial: [phone]   For scheduling needs, including cancellations and rescheduling, please call [place], [phone].

## 2022-04-22 NOTE — Progress Notes (Unsigned)
Enrolled patient for a 3 day Zio XT monitor to be mailed to patients home  

## 2022-04-27 ENCOUNTER — Ambulatory Visit
Admission: RE | Admit: 2022-04-27 | Discharge: 2022-04-27 | Disposition: A | Discharge status: Home / Self Care | Payer: BC Managed Care – PPO | Source: Ambulatory Visit | Attending: Family Medicine | Admitting: Family Medicine

## 2022-04-27 DIAGNOSIS — R945 Abnormal results of liver function studies: Secondary | ICD-10-CM | POA: Diagnosis not present

## 2022-04-27 DIAGNOSIS — R7989 Other specified abnormal findings of blood chemistry: Secondary | ICD-10-CM

## 2022-04-27 DIAGNOSIS — Z713 Dietary counseling and surveillance: Secondary | ICD-10-CM | POA: Diagnosis not present

## 2022-04-28 ENCOUNTER — Telehealth (HOSPITAL_COMMUNITY): Payer: Self-pay | Admitting: Emergency Medicine

## 2022-04-28 DIAGNOSIS — E669 Obesity, unspecified: Secondary | ICD-10-CM | POA: Diagnosis not present

## 2022-04-28 DIAGNOSIS — R748 Abnormal levels of other serum enzymes: Secondary | ICD-10-CM | POA: Diagnosis not present

## 2022-04-28 DIAGNOSIS — Z6833 Body mass index (BMI) 33.0-33.9, adult: Secondary | ICD-10-CM | POA: Diagnosis not present

## 2022-04-28 NOTE — Telephone Encounter (Signed)
Pt states she wishes to cancel appt. Marchia Bond RN Navigator Cardiac Imaging [facility] [phone] Office  [phone] Cell

## 2022-05-02 ENCOUNTER — Ambulatory Visit (HOSPITAL_COMMUNITY): Payer: BC Managed Care – PPO

## 2022-05-03 DIAGNOSIS — R000 Tachycardia, unspecified: Secondary | ICD-10-CM

## 2022-05-03 DIAGNOSIS — R072 Precordial pain: Secondary | ICD-10-CM | POA: Diagnosis not present

## 2022-05-03 DIAGNOSIS — Z6833 Body mass index (BMI) 33.0-33.9, adult: Secondary | ICD-10-CM | POA: Diagnosis not present

## 2022-05-04 DIAGNOSIS — Z713 Dietary counseling and surveillance: Secondary | ICD-10-CM | POA: Diagnosis not present

## 2022-05-18 DIAGNOSIS — Z713 Dietary counseling and surveillance: Secondary | ICD-10-CM | POA: Diagnosis not present

## 2022-05-25 ENCOUNTER — Ambulatory Visit (HOSPITAL_COMMUNITY): Payer: BC Managed Care – PPO | Attending: Internal Medicine

## 2022-05-25 DIAGNOSIS — Z6833 Body mass index (BMI) 33.0-33.9, adult: Secondary | ICD-10-CM | POA: Diagnosis not present

## 2022-05-25 DIAGNOSIS — R000 Tachycardia, unspecified: Secondary | ICD-10-CM | POA: Diagnosis not present

## 2022-05-25 DIAGNOSIS — R072 Precordial pain: Secondary | ICD-10-CM | POA: Diagnosis not present

## 2022-05-25 DIAGNOSIS — Z713 Dietary counseling and surveillance: Secondary | ICD-10-CM | POA: Diagnosis not present

## 2022-05-25 LAB — ECHOCARDIOGRAM COMPLETE
Area-P 1/2: 3.24 cm2
Calc EF: 52.9 %
S' Lateral: 2.9 cm
Single Plane A2C EF: 54.9 %
Single Plane A4C EF: 52.7 %

## 2022-05-25 NOTE — Progress Notes (Unsigned)
Patient declined the administration of Definity at this time.

## 2022-06-01 DIAGNOSIS — Z713 Dietary counseling and surveillance: Secondary | ICD-10-CM | POA: Diagnosis not present

## 2022-06-08 DIAGNOSIS — Z713 Dietary counseling and surveillance: Secondary | ICD-10-CM | POA: Diagnosis not present

## 2022-06-29 DIAGNOSIS — Z713 Dietary counseling and surveillance: Secondary | ICD-10-CM | POA: Diagnosis not present

## 2022-07-02 DIAGNOSIS — S0501XA Injury of conjunctiva and corneal abrasion without foreign body, right eye, initial encounter: Secondary | ICD-10-CM | POA: Diagnosis not present

## 2022-07-02 DIAGNOSIS — H1031 Unspecified acute conjunctivitis, right eye: Secondary | ICD-10-CM | POA: Diagnosis not present

## 2022-07-13 DIAGNOSIS — Z713 Dietary counseling and surveillance: Secondary | ICD-10-CM | POA: Diagnosis not present

## 2022-07-20 DIAGNOSIS — Z713 Dietary counseling and surveillance: Secondary | ICD-10-CM | POA: Diagnosis not present

## 2022-07-27 DIAGNOSIS — F419 Anxiety disorder, unspecified: Secondary | ICD-10-CM | POA: Diagnosis not present

## 2022-07-27 DIAGNOSIS — Z713 Dietary counseling and surveillance: Secondary | ICD-10-CM | POA: Diagnosis not present

## 2022-08-03 DIAGNOSIS — Z713 Dietary counseling and surveillance: Secondary | ICD-10-CM | POA: Diagnosis not present

## 2022-08-03 DIAGNOSIS — F419 Anxiety disorder, unspecified: Secondary | ICD-10-CM | POA: Diagnosis not present

## 2022-08-10 DIAGNOSIS — F419 Anxiety disorder, unspecified: Secondary | ICD-10-CM | POA: Diagnosis not present

## 2022-08-12 DIAGNOSIS — Z713 Dietary counseling and surveillance: Secondary | ICD-10-CM | POA: Diagnosis not present

## 2022-09-06 DIAGNOSIS — F411 Generalized anxiety disorder: Secondary | ICD-10-CM | POA: Diagnosis not present

## 2022-09-08 DIAGNOSIS — Z713 Dietary counseling and surveillance: Secondary | ICD-10-CM | POA: Diagnosis not present

## 2022-09-14 DIAGNOSIS — F419 Anxiety disorder, unspecified: Secondary | ICD-10-CM | POA: Diagnosis not present

## 2022-09-14 DIAGNOSIS — Z713 Dietary counseling and surveillance: Secondary | ICD-10-CM | POA: Diagnosis not present

## 2022-09-28 DIAGNOSIS — Z713 Dietary counseling and surveillance: Secondary | ICD-10-CM | POA: Diagnosis not present

## 2022-09-28 DIAGNOSIS — F419 Anxiety disorder, unspecified: Secondary | ICD-10-CM | POA: Diagnosis not present

## 2022-09-29 DIAGNOSIS — E7800 Pure hypercholesterolemia, unspecified: Secondary | ICD-10-CM | POA: Diagnosis not present

## 2022-09-29 DIAGNOSIS — F411 Generalized anxiety disorder: Secondary | ICD-10-CM | POA: Diagnosis not present

## 2022-09-29 DIAGNOSIS — R748 Abnormal levels of other serum enzymes: Secondary | ICD-10-CM | POA: Diagnosis not present

## 2022-09-29 DIAGNOSIS — Z79899 Other long term (current) drug therapy: Secondary | ICD-10-CM | POA: Diagnosis not present

## 2022-10-06 DIAGNOSIS — R748 Abnormal levels of other serum enzymes: Secondary | ICD-10-CM | POA: Diagnosis not present

## 2022-10-06 DIAGNOSIS — Z79899 Other long term (current) drug therapy: Secondary | ICD-10-CM | POA: Diagnosis not present

## 2022-10-06 DIAGNOSIS — E7800 Pure hypercholesterolemia, unspecified: Secondary | ICD-10-CM | POA: Diagnosis not present

## 2022-10-19 DIAGNOSIS — Z713 Dietary counseling and surveillance: Secondary | ICD-10-CM | POA: Diagnosis not present

## 2022-10-19 DIAGNOSIS — F419 Anxiety disorder, unspecified: Secondary | ICD-10-CM | POA: Diagnosis not present

## 2022-11-02 DIAGNOSIS — F419 Anxiety disorder, unspecified: Secondary | ICD-10-CM | POA: Diagnosis not present

## 2022-11-02 DIAGNOSIS — Z713 Dietary counseling and surveillance: Secondary | ICD-10-CM | POA: Diagnosis not present

## 2022-11-16 DIAGNOSIS — F419 Anxiety disorder, unspecified: Secondary | ICD-10-CM | POA: Diagnosis not present

## 2022-11-16 DIAGNOSIS — Z713 Dietary counseling and surveillance: Secondary | ICD-10-CM | POA: Diagnosis not present

## 2022-12-14 DIAGNOSIS — Z713 Dietary counseling and surveillance: Secondary | ICD-10-CM | POA: Diagnosis not present

## 2022-12-20 DIAGNOSIS — J209 Acute bronchitis, unspecified: Secondary | ICD-10-CM | POA: Diagnosis not present

## 2022-12-27 DIAGNOSIS — J209 Acute bronchitis, unspecified: Secondary | ICD-10-CM | POA: Diagnosis not present

## 2023-01-04 DIAGNOSIS — F419 Anxiety disorder, unspecified: Secondary | ICD-10-CM | POA: Diagnosis not present

## 2023-01-11 DIAGNOSIS — Z713 Dietary counseling and surveillance: Secondary | ICD-10-CM | POA: Diagnosis not present

## 2023-01-11 DIAGNOSIS — F419 Anxiety disorder, unspecified: Secondary | ICD-10-CM | POA: Diagnosis not present

## 2023-01-25 DIAGNOSIS — F419 Anxiety disorder, unspecified: Secondary | ICD-10-CM | POA: Diagnosis not present

## 2023-02-08 DIAGNOSIS — Z713 Dietary counseling and surveillance: Secondary | ICD-10-CM | POA: Diagnosis not present

## 2023-02-08 DIAGNOSIS — F419 Anxiety disorder, unspecified: Secondary | ICD-10-CM | POA: Diagnosis not present

## 2023-03-08 DIAGNOSIS — Z23 Encounter for immunization: Secondary | ICD-10-CM | POA: Diagnosis not present

## 2023-03-08 DIAGNOSIS — R5383 Other fatigue: Secondary | ICD-10-CM | POA: Diagnosis not present

## 2023-03-08 DIAGNOSIS — F411 Generalized anxiety disorder: Secondary | ICD-10-CM | POA: Diagnosis not present

## 2023-03-08 DIAGNOSIS — G4719 Other hypersomnia: Secondary | ICD-10-CM | POA: Diagnosis not present

## 2023-03-15 DIAGNOSIS — F419 Anxiety disorder, unspecified: Secondary | ICD-10-CM | POA: Diagnosis not present

## 2023-03-15 DIAGNOSIS — F909 Attention-deficit hyperactivity disorder, unspecified type: Secondary | ICD-10-CM | POA: Diagnosis not present

## 2023-03-17 DIAGNOSIS — Z713 Dietary counseling and surveillance: Secondary | ICD-10-CM | POA: Diagnosis not present

## 2023-04-19 DIAGNOSIS — Z713 Dietary counseling and surveillance: Secondary | ICD-10-CM | POA: Diagnosis not present

## 2023-04-19 DIAGNOSIS — F419 Anxiety disorder, unspecified: Secondary | ICD-10-CM | POA: Diagnosis not present

## 2023-05-11 DIAGNOSIS — Z1231 Encounter for screening mammogram for malignant neoplasm of breast: Secondary | ICD-10-CM | POA: Diagnosis not present

## 2023-05-11 DIAGNOSIS — Z01419 Encounter for gynecological examination (general) (routine) without abnormal findings: Secondary | ICD-10-CM | POA: Diagnosis not present

## 2023-05-17 DIAGNOSIS — Z713 Dietary counseling and surveillance: Secondary | ICD-10-CM | POA: Diagnosis not present

## 2023-05-17 DIAGNOSIS — F419 Anxiety disorder, unspecified: Secondary | ICD-10-CM | POA: Diagnosis not present

## 2023-05-22 DIAGNOSIS — R0683 Snoring: Secondary | ICD-10-CM | POA: Diagnosis not present

## 2023-05-22 DIAGNOSIS — G4719 Other hypersomnia: Secondary | ICD-10-CM | POA: Diagnosis not present

## 2023-05-22 DIAGNOSIS — G478 Other sleep disorders: Secondary | ICD-10-CM | POA: Diagnosis not present

## 2023-05-22 DIAGNOSIS — F411 Generalized anxiety disorder: Secondary | ICD-10-CM | POA: Diagnosis not present

## 2023-05-31 DIAGNOSIS — F419 Anxiety disorder, unspecified: Secondary | ICD-10-CM | POA: Diagnosis not present

## 2023-06-19 DIAGNOSIS — G4733 Obstructive sleep apnea (adult) (pediatric): Secondary | ICD-10-CM | POA: Diagnosis not present

## 2023-06-21 DIAGNOSIS — K760 Fatty (change of) liver, not elsewhere classified: Secondary | ICD-10-CM | POA: Diagnosis not present

## 2023-06-21 DIAGNOSIS — E782 Mixed hyperlipidemia: Secondary | ICD-10-CM | POA: Diagnosis not present

## 2023-06-21 DIAGNOSIS — F419 Anxiety disorder, unspecified: Secondary | ICD-10-CM | POA: Diagnosis not present

## 2023-06-21 DIAGNOSIS — F32A Depression, unspecified: Secondary | ICD-10-CM | POA: Diagnosis not present

## 2023-06-21 DIAGNOSIS — R0683 Snoring: Secondary | ICD-10-CM | POA: Diagnosis not present

## 2023-06-22 DIAGNOSIS — R0683 Snoring: Secondary | ICD-10-CM | POA: Diagnosis not present

## 2023-06-22 DIAGNOSIS — G478 Other sleep disorders: Secondary | ICD-10-CM | POA: Diagnosis not present

## 2023-06-22 DIAGNOSIS — G4733 Obstructive sleep apnea (adult) (pediatric): Secondary | ICD-10-CM | POA: Diagnosis not present

## 2023-06-22 DIAGNOSIS — E669 Obesity, unspecified: Secondary | ICD-10-CM | POA: Diagnosis not present

## 2023-06-28 DIAGNOSIS — F419 Anxiety disorder, unspecified: Secondary | ICD-10-CM | POA: Diagnosis not present

## 2023-07-05 DIAGNOSIS — R0683 Snoring: Secondary | ICD-10-CM | POA: Diagnosis not present

## 2023-07-05 DIAGNOSIS — E782 Mixed hyperlipidemia: Secondary | ICD-10-CM | POA: Diagnosis not present

## 2023-07-05 DIAGNOSIS — K760 Fatty (change of) liver, not elsewhere classified: Secondary | ICD-10-CM | POA: Diagnosis not present

## 2023-07-05 DIAGNOSIS — F419 Anxiety disorder, unspecified: Secondary | ICD-10-CM | POA: Diagnosis not present

## 2023-07-19 DIAGNOSIS — R0683 Snoring: Secondary | ICD-10-CM | POA: Diagnosis not present

## 2023-07-19 DIAGNOSIS — E782 Mixed hyperlipidemia: Secondary | ICD-10-CM | POA: Diagnosis not present

## 2023-07-19 DIAGNOSIS — Z8659 Personal history of other mental and behavioral disorders: Secondary | ICD-10-CM | POA: Diagnosis not present

## 2023-07-19 DIAGNOSIS — K760 Fatty (change of) liver, not elsewhere classified: Secondary | ICD-10-CM | POA: Diagnosis not present

## 2023-08-08 DIAGNOSIS — F419 Anxiety disorder, unspecified: Secondary | ICD-10-CM | POA: Diagnosis not present

## 2023-08-08 DIAGNOSIS — E782 Mixed hyperlipidemia: Secondary | ICD-10-CM | POA: Diagnosis not present

## 2023-08-08 DIAGNOSIS — K760 Fatty (change of) liver, not elsewhere classified: Secondary | ICD-10-CM | POA: Diagnosis not present

## 2023-11-01 DIAGNOSIS — F419 Anxiety disorder, unspecified: Secondary | ICD-10-CM | POA: Diagnosis not present

## 2023-11-01 DIAGNOSIS — E782 Mixed hyperlipidemia: Secondary | ICD-10-CM | POA: Diagnosis not present

## 2023-11-01 DIAGNOSIS — Z79899 Other long term (current) drug therapy: Secondary | ICD-10-CM | POA: Diagnosis not present

## 2023-11-01 DIAGNOSIS — E66811 Obesity, class 1: Secondary | ICD-10-CM | POA: Diagnosis not present

## 2023-11-01 DIAGNOSIS — K760 Fatty (change of) liver, not elsewhere classified: Secondary | ICD-10-CM | POA: Diagnosis not present

## 2023-11-01 DIAGNOSIS — Z6832 Body mass index (BMI) 32.0-32.9, adult: Secondary | ICD-10-CM | POA: Diagnosis not present

## 2023-12-04 DIAGNOSIS — R110 Nausea: Secondary | ICD-10-CM | POA: Diagnosis not present

## 2023-12-04 DIAGNOSIS — F419 Anxiety disorder, unspecified: Secondary | ICD-10-CM | POA: Diagnosis not present

## 2023-12-04 DIAGNOSIS — K760 Fatty (change of) liver, not elsewhere classified: Secondary | ICD-10-CM | POA: Diagnosis not present

## 2023-12-04 DIAGNOSIS — Z9189 Other specified personal risk factors, not elsewhere classified: Secondary | ICD-10-CM | POA: Diagnosis not present

## 2023-12-04 DIAGNOSIS — E782 Mixed hyperlipidemia: Secondary | ICD-10-CM | POA: Diagnosis not present

## 2023-12-06 DIAGNOSIS — G4719 Other hypersomnia: Secondary | ICD-10-CM | POA: Diagnosis not present

## 2023-12-06 DIAGNOSIS — E669 Obesity, unspecified: Secondary | ICD-10-CM | POA: Diagnosis not present

## 2024-01-23 DIAGNOSIS — K760 Fatty (change of) liver, not elsewhere classified: Secondary | ICD-10-CM | POA: Diagnosis not present

## 2024-01-23 DIAGNOSIS — Z8659 Personal history of other mental and behavioral disorders: Secondary | ICD-10-CM | POA: Diagnosis not present

## 2024-01-23 DIAGNOSIS — F419 Anxiety disorder, unspecified: Secondary | ICD-10-CM | POA: Diagnosis not present

## 2024-01-23 DIAGNOSIS — E782 Mixed hyperlipidemia: Secondary | ICD-10-CM | POA: Diagnosis not present
